# Patient Record
Sex: Male | Born: 1960 | Race: White | Hispanic: No | Marital: Married | Smoking: Former smoker
Health system: Southern US, Community
[De-identification: ages and names within clinical notes are randomized; demographics above are authoritative.]

## PROBLEM LIST (undated history)

## (undated) DIAGNOSIS — E785 Hyperlipidemia, unspecified: Secondary | ICD-10-CM

## (undated) DIAGNOSIS — G4730 Sleep apnea, unspecified: Secondary | ICD-10-CM

## (undated) DIAGNOSIS — I10 Essential (primary) hypertension: Secondary | ICD-10-CM

## (undated) DIAGNOSIS — R7303 Prediabetes: Secondary | ICD-10-CM

## (undated) DIAGNOSIS — M1990 Unspecified osteoarthritis, unspecified site: Secondary | ICD-10-CM

## (undated) HISTORY — PX: POLYPECTOMY: SHX149

## (undated) HISTORY — DX: Unspecified osteoarthritis, unspecified site: M19.90

## (undated) HISTORY — DX: Essential (primary) hypertension: I10

## (undated) HISTORY — DX: Hyperlipidemia, unspecified: E78.5

## (undated) HISTORY — PX: COLONOSCOPY: SHX174

## (undated) HISTORY — PX: HEMORROIDECTOMY: SUR656

## (undated) HISTORY — PX: MENISCUS REPAIR: SHX5179

## (undated) HISTORY — PX: INGUINAL HERNIA REPAIR: SUR1180

## (undated) HISTORY — DX: Sleep apnea, unspecified: G47.30

## (undated) HISTORY — PX: SIGMOIDOSCOPY: SUR1295

---

## 2002-12-27 ENCOUNTER — Encounter: Payer: Self-pay | Admitting: Cardiology

## 2003-01-29 ENCOUNTER — Encounter: Admission: RE | Admit: 2003-01-29 | Discharge: 2003-01-29 | Payer: Self-pay | Admitting: Gastroenterology

## 2003-01-29 ENCOUNTER — Encounter: Payer: Self-pay | Admitting: Gastroenterology

## 2004-12-10 ENCOUNTER — Ambulatory Visit (HOSPITAL_COMMUNITY): Admission: RE | Admit: 2004-12-10 | Discharge: 2004-12-10 | Payer: Self-pay | Admitting: Surgery

## 2004-12-10 ENCOUNTER — Ambulatory Visit (HOSPITAL_BASED_OUTPATIENT_CLINIC_OR_DEPARTMENT_OTHER): Admission: RE | Admit: 2004-12-10 | Discharge: 2004-12-10 | Payer: Self-pay | Admitting: Surgery

## 2010-08-24 ENCOUNTER — Ambulatory Visit (AMBULATORY_SURGERY_CENTER): Payer: PRIVATE HEALTH INSURANCE | Admitting: *Deleted

## 2010-08-24 VITALS — Ht 71.0 in | Wt 200.0 lb

## 2010-08-24 DIAGNOSIS — Z1211 Encounter for screening for malignant neoplasm of colon: Secondary | ICD-10-CM

## 2010-08-24 MED ORDER — PEG-KCL-NACL-NASULF-NA ASC-C 100 G PO SOLR: ORAL | Status: DC

## 2010-09-17 ENCOUNTER — Telehealth: Payer: Self-pay | Admitting: Gastroenterology

## 2010-09-17 NOTE — Telephone Encounter (Signed)
Moviprep Rx called in to [facility].  Pt's wife notified. Ezra Sites

## 2010-09-21 ENCOUNTER — Encounter: Payer: Self-pay | Admitting: Gastroenterology

## 2010-09-21 ENCOUNTER — Ambulatory Visit (AMBULATORY_SURGERY_CENTER): Payer: BC Managed Care – PPO | Admitting: Gastroenterology

## 2010-09-21 VITALS — BP 119/70 | HR 58 | Temp 96.5°F | Resp 16 | Ht 71.0 in | Wt 195.0 lb

## 2010-09-21 DIAGNOSIS — D126 Benign neoplasm of colon, unspecified: Secondary | ICD-10-CM

## 2010-09-21 DIAGNOSIS — Z1211 Encounter for screening for malignant neoplasm of colon: Secondary | ICD-10-CM

## 2010-09-21 DIAGNOSIS — K5730 Diverticulosis of large intestine without perforation or abscess without bleeding: Secondary | ICD-10-CM

## 2010-09-21 MED ORDER — SODIUM CHLORIDE 0.9 % IV SOLN: 500.0000 mL | INTRAVENOUS | Status: DC

## 2010-09-21 NOTE — Patient Instructions (Signed)
See blue & green sheets for discharge instructions.                                                    Information on polyps & diverticulosis & high fiber diet given to carepartner.   Follow safety precautions due to sedation received today.

## 2010-09-22 ENCOUNTER — Telehealth: Payer: Self-pay | Admitting: *Deleted

## 2010-09-24 NOTE — Telephone Encounter (Signed)
NOT Available

## 2011-01-14 NOTE — Progress Notes (Signed)
Addended by: Maple Hudson on: 01/14/2011 05:32 PM   Modules accepted: Orders, Level of Service

## 2015-09-25 ENCOUNTER — Encounter: Payer: Self-pay | Admitting: Gastroenterology

## 2016-10-22 ENCOUNTER — Other Ambulatory Visit: Payer: Self-pay | Admitting: Internal Medicine

## 2016-10-22 ENCOUNTER — Other Ambulatory Visit: Payer: Self-pay | Admitting: Orthopedic Surgery

## 2016-10-22 DIAGNOSIS — M25562 Pain in left knee: Secondary | ICD-10-CM

## 2016-10-30 ENCOUNTER — Ambulatory Visit
Admission: RE | Admit: 2016-10-30 | Discharge: 2016-10-30 | Disposition: A | Discharge status: Home / Self Care | Payer: Self-pay | Source: Ambulatory Visit | Attending: Orthopedic Surgery | Admitting: Orthopedic Surgery

## 2016-10-30 DIAGNOSIS — M25562 Pain in left knee: Secondary | ICD-10-CM

## 2017-09-19 ENCOUNTER — Encounter: Payer: Self-pay | Admitting: Gastroenterology

## 2017-11-25 ENCOUNTER — Encounter: Payer: Self-pay | Admitting: Gastroenterology

## 2017-11-25 ENCOUNTER — Ambulatory Visit (AMBULATORY_SURGERY_CENTER): Payer: Self-pay | Admitting: *Deleted

## 2017-11-25 VITALS — Ht 70.0 in | Wt 197.0 lb

## 2017-11-25 DIAGNOSIS — Z86010 Personal history of colonic polyps: Secondary | ICD-10-CM

## 2017-11-25 MED ORDER — PEG 3350-KCL-NA BICARB-NACL 420 G PO SOLR: 4000.0000 mL | Freq: Once | ORAL | 0 refills | Status: AC

## 2017-11-25 NOTE — Progress Notes (Signed)
No egg or soy allergy known to patient  No issues with past sedation with any surgeries  or procedures, no intubation problems  No diet pills per patient No home 02 use per patient  No blood thinners per patient  Pt denies issues with constipation  No A fib or A flutter  EMMI video sent to pt's e mail pt declined   

## 2017-12-07 ENCOUNTER — Ambulatory Visit (AMBULATORY_SURGERY_CENTER): Payer: 59 | Admitting: Gastroenterology

## 2017-12-07 ENCOUNTER — Encounter: Payer: Self-pay | Admitting: Gastroenterology

## 2017-12-07 VITALS — BP 116/64 | HR 51 | Temp 98.6°F | Resp 10 | Ht 70.0 in | Wt 197.0 lb

## 2017-12-07 DIAGNOSIS — K621 Rectal polyp: Secondary | ICD-10-CM

## 2017-12-07 DIAGNOSIS — Z860100 Personal history of colon polyps, unspecified: Secondary | ICD-10-CM

## 2017-12-07 DIAGNOSIS — D128 Benign neoplasm of rectum: Secondary | ICD-10-CM

## 2017-12-07 DIAGNOSIS — D129 Benign neoplasm of anus and anal canal: Secondary | ICD-10-CM

## 2017-12-07 MED ORDER — SODIUM CHLORIDE 0.9 % IV SOLN: 500.0000 mL | Freq: Once | INTRAVENOUS | Status: DC

## 2017-12-07 NOTE — Progress Notes (Signed)
Pt's states no medical or surgical changes since previsit or office visit. 

## 2017-12-07 NOTE — Progress Notes (Signed)
To PACU, VSS. Report to [email or web address] 

## 2017-12-07 NOTE — Progress Notes (Signed)
Called to room to assist during endoscopic procedure.  Patient ID and intended procedure confirmed with present staff. Received instructions for my participation in the procedure from the performing physician.  

## 2017-12-07 NOTE — Patient Instructions (Signed)
YOU HAD AN ENDOSCOPIC PROCEDURE TODAY AT THE [facility]:   Refer to the procedure report that was given to you for any specific questions about what was found during the examination.  If the procedure report does not answer your questions, please call your gastroenterologist to clarify.  If you requested that your care partner not be given the details of your procedure findings, then the procedure report has been included in a sealed envelope for you to review at your convenience later.  YOU SHOULD EXPECT: Some feelings of bloating in the abdomen. Passage of more gas than usual.  Walking can help get rid of the air that was put into your GI tract during the procedure and reduce the bloating. If you had a lower endoscopy (such as a colonoscopy or flexible sigmoidoscopy) you may notice spotting of blood in your stool or on the toilet paper. If you underwent a bowel prep for your procedure, you may not have a normal bowel movement for a few days.  Please Note:  You might notice some irritation and congestion in your nose or some drainage.  This is from the oxygen used during your procedure.  There is no need for concern and it should clear up in a day or so.  SYMPTOMS TO REPORT IMMEDIATELY:   Following lower endoscopy (colonoscopy or flexible sigmoidoscopy):  Excessive amounts of blood in the stool  Significant tenderness or worsening of abdominal pains  Swelling of the abdomen that is new, acute  Fever of 100F or higher  Please see handouts given to you on Polyps.  For urgent or emergent issues, a gastroenterologist can be reached at any hour by calling [phone].   DIET:  We do recommend a small meal at first, but then you may proceed to your regular diet.  Drink plenty of fluids but you should avoid alcoholic beverages for 24 hours.  ACTIVITY:  You should plan to take it easy for the rest of today and you should NOT DRIVE or use heavy machinery until tomorrow (because of the  sedation medicines used during the test).    FOLLOW UP: Our staff will call the number listed on your records the next business day following your procedure to check on you and address any questions or concerns that you may have regarding the information given to you following your procedure. If we do not reach you, we will leave a message.  However, if you are feeling well and you are not experiencing any problems, there is no need to return our call.  We will assume that you have returned to your regular daily activities without incident.  If any biopsies were taken you will be contacted by phone or by letter within the next 1-3 weeks.  Please call us at [phone] if you have not heard about the biopsies in 3 weeks.    SIGNATURES/CONFIDENTIALITY: You and/or your care partner have signed paperwork which will be entered into your electronic medical record.  These signatures attest to the fact that that the information above on your After Visit Summary has been reviewed and is understood.  Full responsibility of the confidentiality of this discharge information lies with you and/or your care-partner.  Thank you for letting us take care of your healthcare needs today. 

## 2017-12-07 NOTE — Op Note (Signed)
[facility] Patient Name: [name] Procedure Date: 12/07/2017 10:42 AM MRN: [id number] Endoscopist: Milus Banister , MD Age: 57 Referring MD:  Date of Birth: [birth year] Gender: Male Account #: [id number] Procedure:                Colonoscopy Indications:              High risk colon cancer surveillance: Personal                            history of colonic polyps; single subCM SSA removed                            2012 Medicines:                Monitored Anesthesia Care Procedure:                Pre-Anesthesia Assessment:                           - Prior to the procedure, a History and Physical                            was performed, and patient medications and                            allergies were reviewed. The patient's tolerance of                            previous anesthesia was also reviewed. The risks                            and benefits of the procedure and the sedation                            options and risks were discussed with the patient.                            All questions were answered, and informed consent                            was obtained. Prior Anticoagulants: The patient has                            taken no previous anticoagulant or antiplatelet                            agents. ASA Grade Assessment: II - A patient with                            mild systemic disease. After reviewing the risks                            and benefits, the patient was deemed in  satisfactory condition to undergo the procedure.                           After obtaining informed consent, the colonoscope                            was passed under direct vision. Throughout the                            procedure, the patient's blood pressure, pulse, and                            oxygen saturations were monitored continuously. The                            Colonoscope was introduced through the anus and                  advanced to the the cecum, identified by                            appendiceal orifice and ileocecal valve. The                            colonoscopy was performed without difficulty. The                            patient tolerated the procedure well. The quality                            of the bowel preparation was good. The ileocecal                            valve, appendiceal orifice, and rectum were                            photographed. Scope In: 10:43:01 AM Scope Out: 10:57:06 AM Scope Withdrawal Time: 0 hours 10 minutes 50 seconds  Total Procedure Duration: 0 hours 14 minutes 5 seconds  Findings:                 A 2 mm polyp was found in the rectum. The polyp was                            sessile. The polyp was removed with a cold snare.                            Resection and retrieval were complete.                           The exam was otherwise without abnormality on                            direct and retroflexion views. Complications:            No immediate complications. Estimated blood loss:  None. Estimated Blood Loss:     Estimated blood loss: none. Impression:               - One 2 mm polyp in the rectum, removed with a cold                            snare. Resected and retrieved.                           - The examination was otherwise normal on direct                            and retroflexion views. Recommendation:           - Patient has a contact number available for                            emergencies. The signs and symptoms of potential                            delayed complications were discussed with the                            patient. Return to normal activities tomorrow.                            Written discharge instructions were provided to the                            patient.                           - Resume previous diet.                           - Continue present medications.                            You will receive a letter within 2-3 weeks with the                            pathology results and my final recommendations.                           If the polyp(s) is proven to be 'pre-cancerous' on                            pathology, you will need repeat colonoscopy in 5                            years. If the polyp(s) is NOT 'precancerous' on                            pathology then you should repeat colon cancer  screening in 10 years with colonoscopy without need                            for colon cancer screening by any method prior to                            then (including stool testing). Milus Banister, MD 12/07/2017 10:59:27 AM This report has been signed electronically.

## 2017-12-08 ENCOUNTER — Telehealth: Payer: Self-pay

## 2017-12-08 NOTE — Telephone Encounter (Signed)
  Follow up Call-  Call back number 12/07/2017  Post procedure Call Back phone  # [phone]  Permission to leave phone message Yes  Some recent data might be hidden     Patient questions:  Do you have a fever, pain , or abdominal swelling? No. Pain Score  0 *  Have you tolerated food without any problems? Yes.    Have you been able to return to your normal activities? Yes.    Do you have any questions about your discharge instructions: Diet   No. Medications  No. Follow up visit  No.  Do you have questions or concerns about your Care? No.  Actions: * If pain score is 4 or above: No action needed, pain <4.

## 2017-12-14 ENCOUNTER — Encounter: Payer: Self-pay | Admitting: Gastroenterology

## 2019-01-08 NOTE — Patient Instructions (Signed)
DUE TO COVID-19 ONLY ONE VISITOR IS ALLOWED TO COME WITH YOU AND STAY IN THE WAITING ROOM ONLY DURING PRE OP AND PROCEDURE DAY OF SURGERY. THE 1 VISITOR MAY VISIT WITH YOU AFTER SURGERY IN YOUR PRIVATE ROOM DURING VISITING HOURS ONLY!  YOU NEED TO HAVE A COVID 19 TEST ON_______ @_______ , THIS TEST MUST BE DONE BEFORE SURGERY, COME  [name], [place] , [address].  ([place]) ONCE YOUR COVID TEST IS COMPLETED, PLEASE BEGIN THE QUARANTINE INSTRUCTIONS AS OUTLINED IN YOUR HANDOUT.                [name]  01/08/2019   Your procedure is scheduled on: 01-17-19   Report to [facility] Main  Entrance   Report to admitting at        [place] AM     Call this number if you have problems the morning of surgery [phone]    Remember: NO SOLID FOOD AFTER MIDNIGHT THE NIGHT PRIOR TO SURGERY. NOTHING BY MOUTH EXCEPT CLEAR LIQUIDS UNTIL    0530 am . PLEASE FINISH ENSURE DRINK PER SURGEON ORDER  WHICH NEEDS TO BE COMPLETED AT     0530 am then nothing by mouth .    CLEAR LIQUID DIET   Foods Allowed                                                                     Foods Excluded  Coffee and tea, regular and decaf                             liquids that you cannot  Plain Jell-O any favor except red or purple                                           see through such as: Fruit ices (not with fruit pulp)                                     milk, soups, orange juice  Iced Popsicles                                    All solid food Carbonated beverages, regular and diet                                    Cranberry, grape and apple juices Sports drinks like Gatorade Lightly seasoned clear broth or consume(fat free) Sugar, honey syrup  __________________________________________________________________    BRUSH YOUR TEETH MORNING OF SURGERY AND RINSE YOUR MOUTH OUT, NO CHEWING GUM CANDY OR MINTS.     Take these medicines the morning of surgery with A SIP OF  WATER: none  You may not have any metal on your body including hair pins and              piercings  Do not wear jewelry,  lotions, powders or perfumes, deodorant                   Men may shave face and neck.   Do not bring valuables to the hospital. [place].  Contacts, dentures or bridgework may not be worn into surgery.  .               Please read over the following fact sheets you were given: _____________________________________________________________________           [facility] - Preparing for Surgery Before surgery, you can play an important role.  Because skin is not sterile, your skin needs to be as free of germs as possible.  You can reduce the number of germs on your skin by washing with CHG (chlorahexidine gluconate) soap before surgery.  CHG is an antiseptic cleaner which kills germs and bonds with the skin to continue killing germs even after washing. Please DO NOT use if you have an allergy to CHG or antibacterial soaps.  If your skin becomes reddened/irritated stop using the CHG and inform your nurse when you arrive at Short Stay. Do not shave (including legs and underarms) for at least 48 hours prior to the first CHG shower.  You may shave your face/neck. Please follow these instructions carefully:  1.  Shower with CHG Soap the night before surgery and the  morning of Surgery.  2.  If you choose to wash your hair, wash your hair first as usual with your  normal  shampoo.  3.  After you shampoo, rinse your hair and body thoroughly to remove the  shampoo.                           4.  Use CHG as you would any other liquid soap.  You can apply chg directly  to the skin and wash                       Gently with a scrungie or clean washcloth.  5.  Apply the CHG Soap to your body ONLY FROM THE NECK DOWN.   Do not use on face/ open                           Wound or open sores. Avoid contact  with eyes, ears mouth and genitals (private parts).                       Wash face,  Genitals (private parts) with your normal soap.             6.  Wash thoroughly, paying special attention to the area where your surgery  will be performed.  7.  Thoroughly rinse your body with warm water from the neck down.  8.  DO NOT shower/wash with your normal soap after using and rinsing off  the CHG Soap.                9.  Pat yourself dry with a clean towel.  10.  Wear clean pajamas.            11.  Place clean sheets on your bed the night of your first shower and do not  sleep with pets. Day of Surgery : Do not apply any lotions/deodorants the morning of surgery.  Please wear clean clothes to the hospital/surgery center.  FAILURE TO FOLLOW THESE INSTRUCTIONS MAY RESULT IN THE CANCELLATION OF YOUR SURGERY PATIENT SIGNATURE_________________________________  NURSE SIGNATURE__________________________________  ________________________________________________________________________   [name]  An incentive spirometer is a tool that can help keep your lungs clear and active. This tool measures how well you are filling your lungs with each breath. Taking long deep breaths may help reverse or decrease the chance of developing breathing (pulmonary) problems (especially infection) following:  A long period of time when you are unable to move or be active. BEFORE THE PROCEDURE   If the spirometer includes an indicator to show your best effort, your nurse or respiratory therapist will set it to a desired goal.  If possible, sit up straight or lean slightly forward. Try not to slouch.  Hold the incentive spirometer in an upright position. INSTRUCTIONS FOR USE  1. Sit on the edge of your bed if possible, or sit up as far as you can in bed or on a chair. 2. Hold the incentive spirometer in an upright position. 3. Breathe out normally. 4. Place the mouthpiece in your mouth and seal  your lips tightly around it. 5. Breathe in slowly and as deeply as possible, raising the piston or the ball toward the top of the column. 6. Hold your breath for 3-5 seconds or for as long as possible. Allow the piston or ball to fall to the bottom of the column. 7. Remove the mouthpiece from your mouth and breathe out normally. 8. Rest for a few seconds and repeat Steps 1 through 7 at least 10 times every 1-2 hours when you are awake. Take your time and take a few normal breaths between deep breaths. 9. The spirometer may include an indicator to show your best effort. Use the indicator as a goal to work toward during each repetition. 10. After each set of 10 deep breaths, practice coughing to be sure your lungs are clear. If you have an incision (the cut made at the time of surgery), support your incision when coughing by placing a pillow or rolled up towels firmly against it. Once you are able to get out of bed, walk around indoors and cough well. You may stop using the incentive spirometer when instructed by your caregiver.  RISKS AND COMPLICATIONS  Take your time so you do not get dizzy or light-headed.  If you are in pain, you may need to take or ask for pain medication before doing incentive spirometry. It is harder to take a deep breath if you are having pain. AFTER USE  Rest and breathe slowly and easily.  It can be helpful to keep track of a log of your progress. Your caregiver can provide you with a simple table to help with this. If you are using the spirometer at home, follow these instructions: [place] IF:   You are having difficultly using the spirometer.  You have trouble using the spirometer as often as instructed.  Your pain medication is not giving enough relief while using the spirometer.  You develop fever of 100.5 F (38.1 C) or higher. SEEK IMMEDIATE MEDICAL CARE IF:   You cough up bloody sputum  that had not been present before.  You develop fever of  102 F (38.9 C) or greater.  You develop worsening pain at or near the incision site. MAKE SURE YOU:   Understand these instructions.  Will watch your condition.  Will get help right away if you are not doing well or get worse. Document Released: 08/09/2006 Document Revised: 06/21/2011 Document Reviewed: 10/10/2006 Exit[employer] Patient Information 2014 [employer], [place].   ________________________________________________________________________  WHAT IS A BLOOD TRANSFUSION? Blood Transfusion Information  A transfusion is the replacement of blood or some of its parts. Blood is made up of multiple cells which provide different functions.  Red blood cells carry oxygen and are used for blood loss replacement.  White blood cells fight against infection.  Platelets control bleeding.  Plasma helps clot blood.  Other blood products are available for specialized needs, such as hemophilia or other clotting disorders. BEFORE THE TRANSFUSION  Who gives blood for transfusions?   Healthy volunteers who are fully evaluated to make sure their blood is safe. This is blood bank blood. Transfusion therapy is the safest it has ever been in the practice of medicine. Before blood is taken from a donor, a complete history is taken to make sure that person has no history of diseases nor engages in risky social behavior (examples are intravenous drug use or sexual activity with multiple partners). The donor's travel history is screened to minimize risk of transmitting infections, such as malaria. The donated blood is tested for signs of infectious diseases, such as HIV and hepatitis. The blood is then tested to be sure it is compatible with you in order to minimize the chance of a transfusion reaction. If you or a relative donates blood, this is often done in anticipation of surgery and is not appropriate for emergency situations. It takes many days to process the donated blood. RISKS AND COMPLICATIONS Although  transfusion therapy is very safe and saves many lives, the main dangers of transfusion include:   Getting an infectious disease.  Developing a transfusion reaction. This is an allergic reaction to something in the blood you were given. Every precaution is taken to prevent this. The decision to have a blood transfusion has been considered carefully by your caregiver before blood is given. Blood is not given unless the benefits outweigh the risks. AFTER THE TRANSFUSION  Right after receiving a blood transfusion, you will usually feel much better and more energetic. This is especially true if your red blood cells have gotten low (anemic). The transfusion raises the level of the red blood cells which carry oxygen, and this usually causes an energy increase.  The nurse administering the transfusion will monitor you carefully for complications. HOME CARE INSTRUCTIONS  No special instructions are needed after a transfusion. You may find your energy is better. Speak with your caregiver about any limitations on activity for underlying diseases you may have. SEEK MEDICAL CARE IF:   Your condition is not improving after your transfusion.  You develop redness or irritation at the intravenous (IV) site. SEEK IMMEDIATE MEDICAL CARE IF:  Any of the following symptoms occur over the next 12 hours:  Shaking chills.  You have a temperature by mouth above 102 F (38.9 C), not controlled by medicine.  Chest, back, or muscle pain.  People around you feel you are not acting correctly or are confused.  Shortness of breath or difficulty breathing.  Dizziness and fainting.  You get a rash or develop hives.  You have a decrease  in urine output.  Your urine turns a dark color or changes to pink, red, or brown. Any of the following symptoms occur over the next 10 days:  You have a temperature by mouth above 102 F (38.9 C), not controlled by medicine.  Shortness of breath.  Weakness after normal  activity.  The white part of the eye turns yellow (jaundice).  You have a decrease in the amount of urine or are urinating less often.  Your urine turns a dark color or changes to pink, red, or brown. Document Released: 03/26/2000 Document Revised: 06/21/2011 Document Reviewed: 11/13/2007 [facility] Patient Information 2014 [place], [place].  _______________________________________________________________________

## 2019-01-09 ENCOUNTER — Encounter (HOSPITAL_COMMUNITY)
Admission: RE | Admit: 2019-01-09 | Discharge: 2019-01-09 | Disposition: A | Discharge status: Home / Self Care | Payer: PRIVATE HEALTH INSURANCE | Source: Ambulatory Visit | Attending: Orthopedic Surgery | Admitting: Orthopedic Surgery

## 2019-01-09 ENCOUNTER — Encounter (HOSPITAL_COMMUNITY): Payer: Self-pay

## 2019-01-09 ENCOUNTER — Other Ambulatory Visit: Payer: Self-pay

## 2019-01-09 DIAGNOSIS — M1712 Unilateral primary osteoarthritis, left knee: Secondary | ICD-10-CM | POA: Insufficient documentation

## 2019-01-09 DIAGNOSIS — Z01818 Encounter for other preprocedural examination: Secondary | ICD-10-CM | POA: Diagnosis present

## 2019-01-09 HISTORY — DX: Prediabetes: R73.03

## 2019-01-09 LAB — PROTIME-INR
INR: 1 (ref 0.8–1.2)
Prothrombin Time: 12.6 seconds (ref 11.4–15.2)

## 2019-01-09 LAB — APTT: aPTT: 32 seconds (ref 24–36)

## 2019-01-09 LAB — ABO/RH: ABO/RH(D): O POS

## 2019-01-09 LAB — SURGICAL PCR SCREEN
MRSA, PCR: NEGATIVE
Staphylococcus aureus: NEGATIVE

## 2019-01-09 NOTE — Progress Notes (Signed)
PCP - Hulan Fess  Clearance 01-05-19 on chart Cardiologist -   LABS CBC/DIFF, BMP  HGBa1C 01-05-19 on chart  Chest x-ray -  EKG - 9-25-20chart  Stress Test -  ECHO -  Cardiac Cath -   Sleep Study - osa mild CPAP - no  Fasting Blood Sugar -  Checks Blood Sugar _____ times a day  Blood Thinner Instructions: Aspirin Instructions: Last Dose:  Anesthesia review:   Patient denies shortness of breath, fever, cough and chest pain at PAT appointment  NONE   Patient verbalized understanding of instructions that were given to them at the PAT appointment. Patient was also instructed that they will need to review over the PAT instructions again at home before surgery.

## 2019-01-13 ENCOUNTER — Other Ambulatory Visit (HOSPITAL_COMMUNITY)
Admission: RE | Admit: 2019-01-13 | Discharge: 2019-01-13 | Disposition: A | Discharge status: Home / Self Care | Payer: PRIVATE HEALTH INSURANCE | Source: Ambulatory Visit | Attending: Orthopedic Surgery | Admitting: Orthopedic Surgery

## 2019-01-13 DIAGNOSIS — Z20828 Contact with and (suspected) exposure to other viral communicable diseases: Secondary | ICD-10-CM | POA: Diagnosis not present

## 2019-01-13 DIAGNOSIS — Z01812 Encounter for preprocedural laboratory examination: Secondary | ICD-10-CM | POA: Insufficient documentation

## 2019-01-14 LAB — NOVEL CORONAVIRUS, NAA (HOSP ORDER, SEND-OUT TO REF LAB; TAT 18-24 HRS): SARS-CoV-2, NAA: NOT DETECTED

## 2019-01-15 NOTE — H&P (Signed)
TOTAL KNEE ADMISSION H&P  Patient is being admitted for left total knee arthroplasty.  Subjective:  Chief Complaint:left knee pain.  HPI: [name], 58 y.o. male, has a history of pain and functional disability in the left knee due to arthritis and has failed non-surgical conservative treatments for greater than 12 weeks to includeNSAID's and/or analgesics, corticosteriod injections, viscosupplementation injections and activity modification.  Onset of symptoms was gradual, starting 5 years ago with gradually worsening course since that time. The patient noted prior procedures on the knee to include  arthroscopy and menisectomy on the left knee(s).  Patient currently rates pain in the left knee(s) at 7 out of 10 with activity. Patient has night pain, worsening of pain with activity and weight bearing, pain that interferes with activities of daily living, pain with passive range of motion, crepitus and joint swelling.  Patient has evidence of periarticular osteophytes and joint space narrowing by imaging studies. There is no active infection.   Past Medical History:  Diagnosis Date  . Arthritis    knees  . Hyperlipidemia   . Hypertension   . Pre-diabetes     Past Surgical History:  Procedure Laterality Date  . COLONOSCOPY    . HEMORROIDECTOMY    . INGUINAL HERNIA REPAIR    . MENISCUS REPAIR Left   . POLYPECTOMY    . SIGMOIDOSCOPY  9yrs ago   Dr.Edwards    Current Facility-Administered Medications  Medication Dose Route Frequency Provider Last Rate Last Dose  . 0.9 %  sodium chloride infusion  500 mL Intravenous Once Milus Banister, MD       Current Outpatient Medications  Medication Sig Dispense Refill Last Dose  . atorvastatin (LIPITOR) 10 MG tablet Take 10 mg by mouth daily.      Marland Kitchen losartan (COZAAR) 50 MG tablet Take 50 mg by mouth daily.     . Multiple Vitamin (MULTIVITAMIN) tablet Take 1 tablet by mouth daily.        No Known Allergies  Social History   Tobacco Use   . Smoking status: Former Research scientist (life sciences)  . Smokeless tobacco: Former Systems developer  . Tobacco comment: unknown quit date   Substance Use Topics  . Alcohol use: Not Currently    Alcohol/week: 18.0 standard drinks    Types: 18 Cans of beer per week    Frequency: Never    Comment: no etoh now x 3 yrs     Family History  Problem Relation Age of Onset  . Colon cancer Neg Hx   . Colon polyps Neg Hx   . Esophageal cancer Neg Hx   . Rectal cancer Neg Hx   . Stomach cancer Neg Hx      Review of Systems  Constitutional: Negative.   HENT: Negative.   Eyes: Negative.   Respiratory: Negative.   Cardiovascular: Negative.   Gastrointestinal: Negative.   Genitourinary: Negative.   Musculoskeletal: Positive for myalgias. Negative for back pain, falls and neck pain.  Skin: Negative.   Neurological: Negative.   Endo/Heme/Allergies: Negative.   Psychiatric/Behavioral: Negative.     Objective:  Physical Exam  Constitutional: He is oriented to person, place, and time. He appears well-developed. No distress.  Overweight  HENT:  Head: Normocephalic and atraumatic.  Right Ear: External ear normal.  Left Ear: External ear normal.  Nose: Nose normal.  Mouth/Throat: Oropharynx is clear and moist.  Eyes: Conjunctivae and EOM are normal.  Neck: Normal range of motion. Neck supple.  Cardiovascular: Normal rate, regular rhythm,  normal heart sounds and intact distal pulses.  No murmur heard. Respiratory: Effort normal and breath sounds normal. No respiratory distress. He has no wheezes.  GI: Soft. Bowel sounds are normal. He exhibits no distension. There is no abdominal tenderness.  Musculoskeletal:     Comments: Moderate tenderness to palpation about the medial joint line of the left knee. ROM left knee 5-125 degrees. Mild effusion noted in the left knee. No instability about the left knee but mild varus deformity. Mild patellofemoral crepitus in the left knee. Normal painless motion of the hips.   Neurological: He is alert and oriented to person, place, and time. He has normal strength. No sensory deficit.  Skin: No rash noted. He is not diaphoretic. No erythema.  Psychiatric: He has a normal mood and affect. His behavior is normal.    Vital Signs BP: 120/76 sitting L arm Pulse: 68 bpm regular   Estimated body mass index is 28.12 kg/m as calculated from the following:   Height as of 01/09/19: 5\' 10"  (1.778 m).   Weight as of 01/09/19: 88.9 kg.   Imaging Review Plain radiographs demonstrate severe degenerative joint disease of the left knee(s). The overall alignment ismild varus. The bone quality appears to be good for age and reported activity level.   Assessment/Plan:  End stage primary osteoarthritis, left knee   The patient history, physical examination, clinical judgment of the provider and imaging studies are consistent with end stage degenerative joint disease of the left knee(s) and total knee arthroplasty is deemed medically necessary. The treatment options including medical management, injection therapy arthroscopy and arthroplasty were discussed at length. The risks and benefits of total knee arthroplasty were presented and reviewed. The risks due to aseptic loosening, infection, stiffness, patella tracking problems, thromboembolic complications and other imponderables were discussed. The patient acknowledged the explanation, agreed to proceed with the plan and consent was signed. Patient is being admitted for inpatient treatment for surgery, pain control, PT, OT, prophylactic antibiotics, VTE prophylaxis, progressive ambulation and ADL's and discharge planning. The patient is planning to be discharged home.     Anticipated LOS equal to or greater than 2 midnights due to - Age 35 and older with one or more of the following:  - Obesity  - Expected need for hospital services (PT, OT, Nursing) required for safe  discharge  - Anticipated need for postoperative skilled  nursing care or inpatient rehab  - Active co-morbidities: Diabetes OR   - Unanticipated findings during/Post Surgery: None  - Patient is a high risk of re-admission due to: None   Risks and benefits of the surgery were discussed with the patient and Dr.Gioffre at their previous office visit, and the patient has elected to move forward with the aforementioned surgery. Post-operative care plans were discussed with the patient today.  Therapy Plans: outpatient therapy at [facility] on 10/12 Disposition: Home with wife Planned DVT prophylaxis: aspirin 325mg  BID DME needed: has equipment PCP: Dr. Rex Kras Other: no anesthesia concerns  Instructed patient on meds to stop prior to surgery  Ardeen Jourdain, PA-C

## 2019-01-17 ENCOUNTER — Inpatient Hospital Stay (HOSPITAL_COMMUNITY)
Admission: RE | Admit: 2019-01-17 | Discharge: 2019-01-19 | DRG: 470 | Disposition: A | Discharge status: Home / Self Care | Payer: PRIVATE HEALTH INSURANCE | Attending: Orthopedic Surgery | Admitting: Orthopedic Surgery

## 2019-01-17 ENCOUNTER — Encounter (HOSPITAL_COMMUNITY)
Admission: RE | Disposition: A | Discharge status: Home / Self Care | Payer: Self-pay | Source: Home / Self Care | Attending: Orthopedic Surgery

## 2019-01-17 ENCOUNTER — Inpatient Hospital Stay (HOSPITAL_COMMUNITY): Payer: PRIVATE HEALTH INSURANCE | Admitting: Physician Assistant

## 2019-01-17 ENCOUNTER — Encounter (HOSPITAL_COMMUNITY): Payer: Self-pay

## 2019-01-17 ENCOUNTER — Inpatient Hospital Stay (HOSPITAL_COMMUNITY): Payer: PRIVATE HEALTH INSURANCE | Admitting: Certified Registered Nurse Anesthetist

## 2019-01-17 ENCOUNTER — Other Ambulatory Visit: Payer: Self-pay

## 2019-01-17 DIAGNOSIS — E663 Overweight: Secondary | ICD-10-CM | POA: Diagnosis present

## 2019-01-17 DIAGNOSIS — Z79899 Other long term (current) drug therapy: Secondary | ICD-10-CM

## 2019-01-17 DIAGNOSIS — Z6828 Body mass index (BMI) 28.0-28.9, adult: Secondary | ICD-10-CM

## 2019-01-17 DIAGNOSIS — T84033A Mechanical loosening of internal left knee prosthetic joint, initial encounter: Secondary | ICD-10-CM | POA: Diagnosis present

## 2019-01-17 DIAGNOSIS — R7303 Prediabetes: Secondary | ICD-10-CM | POA: Diagnosis present

## 2019-01-17 DIAGNOSIS — I10 Essential (primary) hypertension: Secondary | ICD-10-CM | POA: Diagnosis present

## 2019-01-17 DIAGNOSIS — R42 Dizziness and giddiness: Secondary | ICD-10-CM | POA: Diagnosis not present

## 2019-01-17 DIAGNOSIS — Z87891 Personal history of nicotine dependence: Secondary | ICD-10-CM

## 2019-01-17 DIAGNOSIS — M1712 Unilateral primary osteoarthritis, left knee: Principal | ICD-10-CM | POA: Diagnosis present

## 2019-01-17 DIAGNOSIS — E785 Hyperlipidemia, unspecified: Secondary | ICD-10-CM | POA: Diagnosis present

## 2019-01-17 HISTORY — PX: TOTAL KNEE ARTHROPLASTY: SHX125

## 2019-01-17 LAB — TYPE AND SCREEN
ABO/RH(D): O POS
Antibody Screen: NEGATIVE

## 2019-01-17 SURGERY — ARTHROPLASTY, KNEE, TOTAL
Anesthesia: Spinal | Site: Knee | Laterality: Left

## 2019-01-17 MED ORDER — ACETAMINOPHEN 500 MG PO TABS
1000.0000 mg | ORAL_TABLET | Freq: Four times a day (QID) | ORAL | Status: AC
  Administered 2019-01-17 – 2019-01-18 (×4): 1000 mg via ORAL
  Filled 2019-01-17 (×4): qty 2

## 2019-01-17 MED ORDER — SODIUM CHLORIDE 0.9 % IR SOLN
Status: DC | PRN
  Administered 2019-01-17: 1000 mL

## 2019-01-17 MED ORDER — CEFAZOLIN SODIUM-DEXTROSE 2-4 GM/100ML-% IV SOLN
2.0000 g | INTRAVENOUS | Status: AC
  Administered 2019-01-17: 09:00:00 2000 mg via INTRAVENOUS
  Filled 2019-01-17: qty 100

## 2019-01-17 MED ORDER — ACETAMINOPHEN 325 MG PO TABS: 325.0000 mg | ORAL_TABLET | Freq: Four times a day (QID) | ORAL | Status: DC | PRN

## 2019-01-17 MED ORDER — LOSARTAN POTASSIUM 50 MG PO TABS
50.0000 mg | ORAL_TABLET | Freq: Every day | ORAL | Status: DC
  Filled 2019-01-17: qty 1

## 2019-01-17 MED ORDER — TRANEXAMIC ACID-NACL 1000-0.7 MG/100ML-% IV SOLN
1000.0000 mg | INTRAVENOUS | Status: AC
  Administered 2019-01-17: 09:00:00 1000 mg via INTRAVENOUS
  Filled 2019-01-17: qty 100

## 2019-01-17 MED ORDER — METOCLOPRAMIDE HCL 5 MG PO TABS: 5.0000 mg | ORAL_TABLET | Freq: Three times a day (TID) | ORAL | Status: DC | PRN

## 2019-01-17 MED ORDER — BUPIVACAINE LIPOSOME 1.3 % IJ SUSP
20.0000 mL | Freq: Once | INTRAMUSCULAR | Status: AC
  Administered 2019-01-17: 20 mL
  Filled 2019-01-17: qty 20

## 2019-01-17 MED ORDER — HYDROMORPHONE HCL 1 MG/ML IJ SOLN
0.5000 mg | INTRAMUSCULAR | Status: DC | PRN
  Administered 2019-01-19: 0.5 mg via INTRAVENOUS
  Filled 2019-01-17: qty 1

## 2019-01-17 MED ORDER — PROPOFOL 10 MG/ML IV BOLUS
INTRAVENOUS | Status: AC
  Filled 2019-01-17: qty 20

## 2019-01-17 MED ORDER — DEXAMETHASONE SODIUM PHOSPHATE 10 MG/ML IJ SOLN
INTRAMUSCULAR | Status: DC | PRN
  Administered 2019-01-17: 4 mg via INTRAVENOUS

## 2019-01-17 MED ORDER — SODIUM CHLORIDE 0.9 % IV SOLN
INTRAVENOUS | Status: DC | PRN
  Administered 2019-01-17: 500 mL

## 2019-01-17 MED ORDER — SODIUM CHLORIDE (PF) 0.9 % IJ SOLN
INTRAMUSCULAR | Status: AC
  Filled 2019-01-17: qty 10

## 2019-01-17 MED ORDER — POLYETHYLENE GLYCOL 3350 17 G PO PACK: 17.0000 g | PACK | Freq: Every day | ORAL | Status: DC | PRN

## 2019-01-17 MED ORDER — PROMETHAZINE HCL 25 MG/ML IJ SOLN: 6.2500 mg | INTRAMUSCULAR | Status: DC | PRN

## 2019-01-17 MED ORDER — CLONIDINE HCL (ANALGESIA) 100 MCG/ML EP SOLN
EPIDURAL | Status: DC | PRN
  Administered 2019-01-17: 70 ug

## 2019-01-17 MED ORDER — OXYCODONE HCL 5 MG PO TABS
5.0000 mg | ORAL_TABLET | ORAL | Status: DC | PRN
  Administered 2019-01-17 – 2019-01-18 (×5): 5 mg via ORAL
  Administered 2019-01-19 (×2): 10 mg via ORAL
  Filled 2019-01-17: qty 2
  Filled 2019-01-17 (×6): qty 1
  Filled 2019-01-17: qty 2

## 2019-01-17 MED ORDER — ONDANSETRON HCL 4 MG/2ML IJ SOLN
INTRAMUSCULAR | Status: AC
  Filled 2019-01-17: qty 2

## 2019-01-17 MED ORDER — PROPOFOL 500 MG/50ML IV EMUL
INTRAVENOUS | Status: AC
  Filled 2019-01-17: qty 50

## 2019-01-17 MED ORDER — METHOCARBAMOL 500 MG IVPB - SIMPLE MED
500.0000 mg | Freq: Four times a day (QID) | INTRAVENOUS | Status: DC | PRN
  Filled 2019-01-17: qty 50

## 2019-01-17 MED ORDER — DEXAMETHASONE SODIUM PHOSPHATE 10 MG/ML IJ SOLN
INTRAMUSCULAR | Status: AC
  Filled 2019-01-17: qty 1

## 2019-01-17 MED ORDER — HYDROMORPHONE HCL 1 MG/ML IJ SOLN: 0.2500 mg | INTRAMUSCULAR | Status: DC | PRN

## 2019-01-17 MED ORDER — FLEET ENEMA 7-19 GM/118ML RE ENEM: 1.0000 | ENEMA | Freq: Once | RECTAL | Status: DC | PRN

## 2019-01-17 MED ORDER — METHOCARBAMOL 500 MG PO TABS
500.0000 mg | ORAL_TABLET | Freq: Four times a day (QID) | ORAL | Status: DC | PRN
  Administered 2019-01-17 – 2019-01-18 (×2): 500 mg via ORAL
  Filled 2019-01-17 (×2): qty 1

## 2019-01-17 MED ORDER — SODIUM CHLORIDE 0.9 % IV SOLN
INTRAVENOUS | Status: AC
  Filled 2019-01-17: qty 500000

## 2019-01-17 MED ORDER — MIDAZOLAM HCL 2 MG/2ML IJ SOLN
INTRAMUSCULAR | Status: AC
  Filled 2019-01-17: qty 2

## 2019-01-17 MED ORDER — BISACODYL 5 MG PO TBEC: 5.0000 mg | DELAYED_RELEASE_TABLET | Freq: Every day | ORAL | Status: DC | PRN

## 2019-01-17 MED ORDER — MIDAZOLAM HCL 5 MG/5ML IJ SOLN
INTRAMUSCULAR | Status: DC | PRN
  Administered 2019-01-17: 1 mg via INTRAVENOUS

## 2019-01-17 MED ORDER — STERILE WATER FOR IRRIGATION IR SOLN
Status: DC | PRN
  Administered 2019-01-17: 2000 mL

## 2019-01-17 MED ORDER — BUPIVACAINE HCL (PF) 0.75 % IJ SOLN
INTRAMUSCULAR | Status: DC | PRN
  Administered 2019-01-17: 2 mL via INTRATHECAL

## 2019-01-17 MED ORDER — PROPOFOL 10 MG/ML IV BOLUS
INTRAVENOUS | Status: DC | PRN
  Administered 2019-01-17: 30 mg via INTRAVENOUS

## 2019-01-17 MED ORDER — ALUM & MAG HYDROXIDE-SIMETH 200-200-20 MG/5ML PO SUSP: 30.0000 mL | ORAL | Status: DC | PRN

## 2019-01-17 MED ORDER — METOCLOPRAMIDE HCL 5 MG/ML IJ SOLN: 5.0000 mg | Freq: Three times a day (TID) | INTRAMUSCULAR | Status: DC | PRN

## 2019-01-17 MED ORDER — ONDANSETRON HCL 4 MG/2ML IJ SOLN: 4.0000 mg | Freq: Four times a day (QID) | INTRAMUSCULAR | Status: DC | PRN

## 2019-01-17 MED ORDER — ONDANSETRON HCL 4 MG/2ML IJ SOLN
INTRAMUSCULAR | Status: DC | PRN
  Administered 2019-01-17: 4 mg via INTRAVENOUS

## 2019-01-17 MED ORDER — SODIUM CHLORIDE (PF) 0.9 % IJ SOLN
INTRAMUSCULAR | Status: DC | PRN
  Administered 2019-01-17: 20 mL

## 2019-01-17 MED ORDER — FENTANYL CITRATE (PF) 100 MCG/2ML IJ SOLN
INTRAMUSCULAR | Status: DC | PRN
  Administered 2019-01-17 (×2): 50 ug via INTRAVENOUS

## 2019-01-17 MED ORDER — CEFAZOLIN SODIUM-DEXTROSE 1-4 GM/50ML-% IV SOLN
1.0000 g | Freq: Four times a day (QID) | INTRAVENOUS | Status: AC
  Administered 2019-01-17 (×2): 1 g via INTRAVENOUS
  Filled 2019-01-17 (×2): qty 50

## 2019-01-17 MED ORDER — FERROUS SULFATE 325 (65 FE) MG PO TABS
325.0000 mg | ORAL_TABLET | Freq: Three times a day (TID) | ORAL | Status: DC
  Administered 2019-01-17 – 2019-01-19 (×5): 325 mg via ORAL
  Filled 2019-01-17 (×5): qty 1

## 2019-01-17 MED ORDER — LACTATED RINGERS IV SOLN
INTRAVENOUS | Status: DC
  Administered 2019-01-17 (×2): via INTRAVENOUS

## 2019-01-17 MED ORDER — CHLORHEXIDINE GLUCONATE 4 % EX LIQD: 60.0000 mL | Freq: Once | CUTANEOUS | Status: DC

## 2019-01-17 MED ORDER — MENTHOL 3 MG MT LOZG: 1.0000 | LOZENGE | OROMUCOSAL | Status: DC | PRN

## 2019-01-17 MED ORDER — PROPOFOL 500 MG/50ML IV EMUL
INTRAVENOUS | Status: DC | PRN
  Administered 2019-01-17: 100 ug/kg/min via INTRAVENOUS

## 2019-01-17 MED ORDER — PHENOL 1.4 % MT LIQD: 1.0000 | OROMUCOSAL | Status: DC | PRN

## 2019-01-17 MED ORDER — RIVAROXABAN 10 MG PO TABS
10.0000 mg | ORAL_TABLET | Freq: Every day | ORAL | Status: DC
  Administered 2019-01-18 – 2019-01-19 (×2): 10 mg via ORAL
  Filled 2019-01-17 (×2): qty 1

## 2019-01-17 MED ORDER — OXYCODONE HCL 5 MG PO TABS: 10.0000 mg | ORAL_TABLET | ORAL | Status: DC | PRN

## 2019-01-17 MED ORDER — DOCUSATE SODIUM 100 MG PO CAPS
100.0000 mg | ORAL_CAPSULE | Freq: Two times a day (BID) | ORAL | Status: DC
  Administered 2019-01-17 – 2019-01-19 (×5): 100 mg via ORAL
  Filled 2019-01-17 (×5): qty 1

## 2019-01-17 MED ORDER — ONDANSETRON HCL 4 MG PO TABS: 4.0000 mg | ORAL_TABLET | Freq: Four times a day (QID) | ORAL | Status: DC | PRN

## 2019-01-17 MED ORDER — FENTANYL CITRATE (PF) 100 MCG/2ML IJ SOLN
INTRAMUSCULAR | Status: AC
  Filled 2019-01-17: qty 2

## 2019-01-17 MED ORDER — LACTATED RINGERS IV SOLN
INTRAVENOUS | Status: DC
  Administered 2019-01-17 – 2019-01-18 (×2): via INTRAVENOUS

## 2019-01-17 MED ORDER — ROPIVACAINE HCL 5 MG/ML IJ SOLN
INTRAMUSCULAR | Status: DC | PRN
  Administered 2019-01-17: 20 mL via PERINEURAL

## 2019-01-17 MED ORDER — POVIDONE-IODINE 10 % EX SWAB
2.0000 "application " | Freq: Once | CUTANEOUS | Status: AC
  Administered 2019-01-17: 2 via TOPICAL

## 2019-01-17 SURGICAL SUPPLY — 71 items
AGENT HMST SPONGE THK3/8 (HEMOSTASIS) ×1
BAG DECANTER FOR FLEXI CONT (MISCELLANEOUS) ×5 IMPLANT
BAG SPEC THK2 15X12 ZIP CLS (MISCELLANEOUS) ×1
BAG ZIPLOCK 12X15 (MISCELLANEOUS) ×2 IMPLANT
BLADE SAG 18X100X1.27 (BLADE) ×3 IMPLANT
BLADE SAW SGTL 11.0X1.19X90.0M (BLADE) ×3 IMPLANT
BLADE SURG SZ10 CARB STEEL (BLADE) ×6 IMPLANT
BNDG ELASTIC 4X5.8 VLCR STR LF (GAUZE/BANDAGES/DRESSINGS) ×3 IMPLANT
BNDG ELASTIC 6X5.8 VLCR STR LF (GAUZE/BANDAGES/DRESSINGS) ×3 IMPLANT
BNDG GAUZE ELAST 4 BULKY (GAUZE/BANDAGES/DRESSINGS) ×6 IMPLANT
CEMENT HV SMART SET (Cement) ×6 IMPLANT
CEMENT TIBIA MBT SIZE 5 (Knees) IMPLANT
CLOSURE WOUND 1/2 X4 (GAUZE/BANDAGES/DRESSINGS) ×2
COVER SURGICAL LIGHT HANDLE (MISCELLANEOUS) ×3 IMPLANT
COVER WAND RF STERILE (DRAPES) IMPLANT
CUFF TOURN SGL QUICK 34 (TOURNIQUET CUFF) ×3
CUFF TRNQT CYL 34X4.125X (TOURNIQUET CUFF) ×1 IMPLANT
DECANTER SPIKE VIAL GLASS SM (MISCELLANEOUS) ×3 IMPLANT
DRAPE INCISE IOBAN 66X45 STRL (DRAPES) IMPLANT
DRAPE U-SHAPE 47X51 STRL (DRAPES) ×3 IMPLANT
DRSG ADAPTIC 3X8 NADH LF (GAUZE/BANDAGES/DRESSINGS) ×3 IMPLANT
DRSG PAD ABDOMINAL 8X10 ST (GAUZE/BANDAGES/DRESSINGS) ×6 IMPLANT
DURAPREP 26ML APPLICATOR (WOUND CARE) ×3 IMPLANT
ELECT BLADE TIP CTD 4 INCH (ELECTRODE) ×3 IMPLANT
ELECT REM PT RETURN 15FT ADLT (MISCELLANEOUS) ×3 IMPLANT
EVACUATOR 1/8 PVC DRAIN (DRAIN) ×3 IMPLANT
FACESHIELD WRAPAROUND (MASK) ×15 IMPLANT
FACESHIELD WRAPAROUND OR TEAM (MASK) ×1 IMPLANT
FEMUR SIGMA PS SZ 4.0 L (Knees) ×2 IMPLANT
GAUZE SPONGE 4X4 12PLY STRL (GAUZE/BANDAGES/DRESSINGS) ×3 IMPLANT
GLOVE BIOGEL PI IND STRL 6.5 (GLOVE) ×1 IMPLANT
GLOVE BIOGEL PI IND STRL 8.5 (GLOVE) ×1 IMPLANT
GLOVE BIOGEL PI INDICATOR 6.5 (GLOVE) ×2
GLOVE BIOGEL PI INDICATOR 8.5 (GLOVE) ×2
GLOVE ECLIPSE 8.0 STRL XLNG CF (GLOVE) ×6 IMPLANT
GLOVE SURG SS PI 6.5 STRL IVOR (GLOVE) ×3 IMPLANT
GOWN STRL REUS W/TWL LRG LVL3 (GOWN DISPOSABLE) ×3 IMPLANT
GOWN STRL REUS W/TWL XL LVL3 (GOWN DISPOSABLE) ×3 IMPLANT
HANDPIECE INTERPULSE COAX TIP (DISPOSABLE) ×3
HEMOSTAT SPONGE AVITENE ULTRA (HEMOSTASIS) ×3 IMPLANT
HOLDER FOLEY CATH W/STRAP (MISCELLANEOUS) IMPLANT
IMMOBILIZER KNEE 20 (SOFTGOODS) ×3
IMMOBILIZER KNEE 20 THIGH 36 (SOFTGOODS) ×1 IMPLANT
KIT TURNOVER KIT A (KITS) IMPLANT
MANIFOLD NEPTUNE II (INSTRUMENTS) ×3 IMPLANT
PACK TOTAL KNEE CUSTOM (KITS) ×3 IMPLANT
PADDING CAST COTTON 6X4 STRL (CAST SUPPLIES) ×3 IMPLANT
PATELLA DOME PFC 41MM (Knees) ×2 IMPLANT
PENCIL SMOKE EVAC W/HOLSTER (ELECTROSURGICAL) ×3 IMPLANT
PIN STEINMAN FIXATION KNEE (PIN) ×2 IMPLANT
PLATE ROT INSERT 12.5MM SIZE 4 (Plate) ×2 IMPLANT
PROTECTOR NERVE ULNAR (MISCELLANEOUS) ×3 IMPLANT
SET HNDPC FAN SPRY TIP SCT (DISPOSABLE) ×1 IMPLANT
SET PAD KNEE POSITIONER (MISCELLANEOUS) ×3 IMPLANT
SPONGE LAP 18X18 RF (DISPOSABLE) IMPLANT
STRIP CLOSURE SKIN 1/2X4 (GAUZE/BANDAGES/DRESSINGS) ×4 IMPLANT
SUT BONE WAX W31G (SUTURE) IMPLANT
SUT MNCRL AB 4-0 PS2 18 (SUTURE) ×3 IMPLANT
SUT STRATAFIX 0 PDS 27 VIOLET (SUTURE) ×3
SUT VIC AB 1 CT1 27 (SUTURE) ×3
SUT VIC AB 1 CT1 27XBRD ANTBC (SUTURE) ×1 IMPLANT
SUT VIC AB 2-0 CT1 27 (SUTURE) ×9
SUT VIC AB 2-0 CT1 TAPERPNT 27 (SUTURE) ×3 IMPLANT
SUTURE STRATFX 0 PDS 27 VIOLET (SUTURE) ×1 IMPLANT
SYR 20ML LL LF (SYRINGE) ×6 IMPLANT
TIBIA MBT CEMENT SIZE 5 (Knees) ×3 IMPLANT
TOWER CARTRIDGE SMART MIX (DISPOSABLE) ×3 IMPLANT
TRAY FOLEY MTR SLVR 16FR STAT (SET/KITS/TRAYS/PACK) ×3 IMPLANT
WATER STERILE IRR 1000ML POUR (IV SOLUTION) ×3 IMPLANT
WRAP KNEE MAXI GEL POST OP (GAUZE/BANDAGES/DRESSINGS) ×3 IMPLANT
YANKAUER SUCT BULB TIP 10FT TU (MISCELLANEOUS) ×3 IMPLANT

## 2019-01-17 NOTE — Anesthesia Preprocedure Evaluation (Signed)
Anesthesia Evaluation  Patient identified by MRN, date of birth, ID band Patient awake    Reviewed: Allergy & Precautions, NPO status , Patient's Chart, lab work & pertinent test results  Airway Mallampati: II  TM Distance: >3 FB Neck ROM: Full    Dental no notable dental hx.    Pulmonary neg pulmonary ROS, former smoker,    Pulmonary exam normal breath sounds clear to auscultation       Cardiovascular hypertension, Normal cardiovascular exam Rhythm:Regular Rate:Normal     Neuro/Psych negative neurological ROS  negative psych ROS   GI/Hepatic negative GI ROS, Neg liver ROS,   Endo/Other  negative endocrine ROS  Renal/GU negative Renal ROS  negative genitourinary   Musculoskeletal negative musculoskeletal ROS (+)   Abdominal   Peds negative pediatric ROS (+)  Hematology negative hematology ROS (+)   Anesthesia Other Findings   Reproductive/Obstetrics negative OB ROS                             Anesthesia Physical Anesthesia Plan  ASA: II  Anesthesia Plan: Spinal   Post-op Pain Management:    Induction: Intravenous  PONV Risk Score and Plan: 2 and Ondansetron, Dexamethasone and Treatment may vary due to age or medical condition  Airway Management Planned: Simple Face Mask  Additional Equipment:   Intra-op Plan:   Post-operative Plan:   Informed Consent: I have reviewed the patients History and Physical, chart, labs and discussed the procedure including the risks, benefits and alternatives for the proposed anesthesia with the patient or authorized representative who has indicated his/her understanding and acceptance.     Dental advisory given  Plan Discussed with: CRNA and Surgeon  Anesthesia Plan Comments:         Anesthesia Quick Evaluation

## 2019-01-17 NOTE — Plan of Care (Signed)

## 2019-01-17 NOTE — Anesthesia Procedure Notes (Signed)
Anesthesia Procedure Image    

## 2019-01-17 NOTE — Evaluation (Signed)
Physical Therapy Evaluation Patient Details Name: [name] MRN: [id number] DOB: [birth year] Today's Date: 01/17/2019   History of Present Illness  Patient is 58 y.o. male s/p Lt TKA on 01/17/19 with PMH significant for HTN, HLD, and OA.  Clinical Impression  [name] is a 59 y.o. male POD 0 s/p Lt TKA. Patient reports independence with mobility at baseline. Patient is now limited by functional impairments (see PT problem list below) and requires min assist for transfers and gait with RW. Patient was able to ambulate ~50 feet with RW and min assist to steady balance secondary to difficulty coordinating Rt LE mobility. Patient instructed in exercise to facilitate ROM and circulation to manage edema. Patient will benefit from continued skilled PT interventions to address impairments and progress towards PLOF. Acute PT will follow to progress mobility and stair training in preparation for safe discharge home.     Follow Up Recommendations Follow surgeon's recommendation for DC plan and follow-up therapies    Equipment Recommendations  None recommended by PT    Recommendations for Other Services       Precautions / Restrictions Precautions Precautions: Fall Restrictions Weight Bearing Restrictions: No      Mobility  Bed Mobility Overal bed mobility: Needs Assistance Bed Mobility: Supine to Sit     Supine to sit: HOB elevated;Min guard     General bed mobility comments: min guard with cues for transfer, no assist required, pt using bed rails  Transfers Overall transfer level: Needs assistance Equipment used: Rolling walker (2 wheeled) Transfers: Sit to/from Stand Sit to Stand: Min assist         General transfer comment: cues for safe hand placement and technique, assist to initiate power up and steady with rising to stand  Ambulation/Gait Ambulation/Gait assistance: Min assist Gait Distance (Feet): 50 Feet Assistive device: Rolling walker (2 wheeled) Gait  Pattern/deviations: Step-to pattern;Drifts right/left;Decreased stance time - left;Decreased stride length;Scissoring;Narrow base of support Gait velocity: decreased   General Gait Details: pt with slow pace and reduced weight shift Lt, pt with difficulty coordinating Rt LE stepping and leaning to Rt requiring min assist to maintain upright posture and prevent LOB.  Stairs            Wheelchair Mobility    Modified Rankin (Stroke Patients Only)       Balance Overall balance assessment: Needs assistance Sitting-balance support: No upper extremity supported;Feet supported Sitting balance-Leahy Scale: Good     Standing balance support: During functional activity;Bilateral upper extremity supported Standing balance-Leahy Scale: Poor Standing balance comment: requires support in standing          Pertinent Vitals/Pain Pain Assessment: No/denies pain    Home Living Family/patient expects to be discharged to:: Private residence Living Arrangements: Spouse/significant other Available Help at Discharge: Family Type of Home: House Home Access: Stairs to enter Entrance Stairs-Rails: Right Entrance Stairs-Number of Steps: 3 steps Home Layout: One level Home Equipment: Walker - 2 wheels;Walker - 4 wheels;Cane - single point;Shower seat;Shower seat - built in      Prior Function Level of Independence: Independent               Hand Dominance   Dominant Hand: Right    Extremity/Trunk Assessment   Upper Extremity Assessment Upper Extremity Assessment: Overall WFL for tasks assessed    Lower Extremity Assessment Lower Extremity Assessment: RLE deficits/detail;LLE deficits/detail RLE Deficits / Details: slightly decreased sensation at Rt hip and pt with difficulty coordinating step sequence/pattern RLE Sensation:  decreased light touch;decreased proprioception RLE Coordination: decreased gross motor LLE Deficits / Details: pt with good quad activaiton and no  extensor lag with SLR, 4/5 for quad and 3/5 for hip flexor or better LLE Sensation: WNL LLE Coordination: WNL    Cervical / Trunk Assessment Cervical / Trunk Assessment: Normal  Communication   Communication: No difficulties  Cognition Arousal/Alertness: Awake/alert Behavior During Therapy: WFL for tasks assessed/performed Overall Cognitive Status: Within Functional Limits for tasks assessed       General Comments      Exercises Total Joint Exercises Ankle Circles/Pumps: AROM;10 reps;Seated;Both Quad Sets: AROM;10 reps;Seated;Supine;Left(5 reps supine, 5 reps seated) Heel Slides: AAROM;10 reps;Seated;Left(with gait belt)   Assessment/Plan    PT Assessment Patient needs continued PT services  PT Problem List Decreased strength;Decreased balance;Decreased mobility;Decreased range of motion;Decreased activity tolerance;Decreased coordination;Decreased knowledge of use of DME       PT Treatment Interventions DME instruction;Balance training;Functional mobility training;Patient/family education;Modalities;Gait training;Therapeutic activities;Therapeutic exercise;Stair training    PT Goals (Current goals can be found in the Care Plan section)  Acute Rehab PT Goals Patient Stated Goal: to return home and get back to independent mobility PT Goal Formulation: With patient Time For Goal Achievement: 01/24/19 Potential to Achieve Goals: Good    Frequency 7X/week    AM-PAC PT "6 Clicks" Mobility  Outcome Measure Help needed turning from your back to your side while in a flat bed without using bedrails?: A Little Help needed moving from lying on your back to sitting on the side of a flat bed without using bedrails?: A Little Help needed moving to and from a bed to a chair (including a wheelchair)?: A Little Help needed standing up from a chair using your arms (e.g., wheelchair or bedside chair)?: A Little Help needed to walk in hospital room?: A Little Help needed climbing 3-5  steps with a railing? : A Little 6 Click Score: 18    End of Session Equipment Utilized During Treatment: Gait belt Activity Tolerance: Patient tolerated treatment well Patient left: with call bell/phone within reach;in chair;with chair alarm set Nurse Communication: Mobility status PT Visit Diagnosis: Other abnormalities of gait and mobility (R26.89);Muscle weakness (generalized) (M62.81);Difficulty in walking, not elsewhere classified (R26.2)    Time: [id number] PT Time Calculation (min) (ACUTE ONLY): 26 min   Charges:   PT Evaluation $PT Eval Low Complexity: 1 Low PT Treatments $Gait Training: 8-22 mins        Kipp Brood, PT, DPT, [facility] Physical Therapist with [facility]  01/17/2019 5:01 PM

## 2019-01-17 NOTE — Op Note (Signed)
NAME: [name], [name] MEDICAL RECORD [id number] ACCOUNT [id number] DATE OF BIRTH:02/05/1961 FACILITY: [facility] LOCATION: [facility] PHYSICIAN:Taniqua Issa Fransico Setters, MD  OPERATIVE REPORT  DATE OF PROCEDURE:  01/17/2019  SURGEON:  Latanya Maudlin, MD  ASSISTANT:  Ardeen Jourdain, PA-C  PREOPERATIVE DIAGNOSES:  Primary osteoarthritis with bone-on-bone of the left knee.  POSTOPERATIVE DIAGNOSES:  Primary osteoarthritis with bone-on-bone of the left knee.  PROCEDURE:  Left total knee arthroplasty.  The prosthesis used was a cemented DePuy left posterior cruciate sacrificing-type knee.  The size of the femoral component was a size 4 left.  The tibial tray was a size 5.  The insert was a size 4, 12.5 mm  thickness.  The patella was a size 41 with 3 pegs.  DESCRIPTION OF PROCEDURE:  Under spinal anesthesia, a routine orthopedic prep and draping of the left lower extremity was carried out.  Note the appropriate timeout was first carried out.  I also marked the appropriate left leg in the holding area.  At  this time after the prep and draping, the leg was exsanguinated with an Esmarch, tourniquet was elevated to 325 mmHg.  Left leg was placed in a De Mayo knee holder.  The knee was flexed.  An anterior approach to the knee was carried out.  Two flaps were  created.  I then carried out a median parapatellar incision, reflected the patella laterally, and flexed the knee.  I then did medial and lateral meniscectomies and excised the anterior and posterior cruciate ligaments.  At that particular time, we then  made our appropriate drill hole through the femoral condyle.  The guide rod was inserted up the canal.  We then removed 11 mm thickness off the distal femur utilizing the appropriate guide.  Following that, we measured the femur to be a size 4 left.  We  then utilized our posterior and anterior chamfering cuts in the usual fashion for the prosthesis.  Once this was completed, we then went down and directed  our attention to the tibial plateau.  The oscillating saw was utilized to remove the spinous  processes.  I did a preliminary measurement of the plateau to be a size 5.  At this time, we then used our external guide and removed 4 mm thickness across the tibial plateau, utilizing the medial side as our guide.  After this was carried out, we then  inserted our lamina spreaders to examine the knee.  There were no spurs present.  We then completed our meniscectomies.  I then inserted my spacer blocks.  We selected a 12.5 mm spacer block.  At this particular time, we then progressed on and did our  tibial plateau keel cut in the usual fashion.  We then did our notch cut out of the distal femur in the usual fashion.  The trial components were inserted.  We had a nice trial fit.  We then reflected the patella and did a resurfacing procedure on the  patella.  Three drill holes were made in the patella for a size 41 patella.  The patellar button was inserted.  We had a nice fit with that as well.  We then removed all 3 trial components and then cemented all 3 components in simultaneously.  Once the  cement was hardened, we removed all loose pieces of cement.  We thoroughly waterpicked out the knee.  I then inserted my size 4, 12.5 mm thickness insert, reduced the knee, and had excellent function and excellent stability.  We had a  real nice fit with  our patella as well.  We irrigated out the knee again, and then I went ahead and inserted a Hemovac drain and closed the knee in layers in the usual fashion.  The knee was taken through flexion and extension.  We had good flexion, extension, good  stability far as medial lateral plane.  We then closed the wound in layers over a Hemovac drain in the usual fashion.  Sterile dressings were applied.  He had 2 g of IV Ancef preop.  Preop, he also had a spinal anesthesia and he also had an adductor  canal nerve block.  The patient will be kept in the hospital for pain  control.  LN/NUANCE  D:01/17/2019 T:01/17/2019 JOB:[id number]

## 2019-01-17 NOTE — Interval H&P Note (Signed)
History and Physical Interval Note:  01/17/2019 8:08 AM  [name]  has presented today for surgery, with the diagnosis of left knee osteoarthritis.  The various methods of treatment have been discussed with the patient and family. After consideration of risks, benefits and other options for treatment, the patient has consented to  Procedure(s) with comments: TOTAL KNEE ARTHROPLASTY (Left) - 179min as a surgical intervention.  The patient's history has been reviewed, patient examined, no change in status, stable for surgery.  I have reviewed the patient's chart and labs.  Questions were answered to the patient's satisfaction.     Latanya Maudlin

## 2019-01-17 NOTE — Anesthesia Postprocedure Evaluation (Signed)
Anesthesia Post Note  Patient: [name]  Procedure(s) Performed: TOTAL KNEE ARTHROPLASTY (Left Knee)     Patient location during evaluation: PACU Anesthesia Type: Spinal Level of consciousness: oriented and awake and alert Pain management: pain level controlled Vital Signs Assessment: post-procedure vital signs reviewed and stable Respiratory status: spontaneous breathing, respiratory function stable and patient connected to nasal cannula oxygen Cardiovascular status: blood pressure returned to baseline and stable Postop Assessment: no headache, no backache and no apparent nausea or vomiting Anesthetic complications: no    Last Vitals:  Vitals:   01/17/19 1517 01/17/19 1548  BP: 105/62 110/63  Pulse: (!) 59 (!) 57  Resp: 16 16  Temp: 36.6 C (!) 36.4 C  SpO2: 98% 99%    Last Pain:  Vitals:   01/17/19 1548  TempSrc: Oral  PainSc:                  Robertha Staples S

## 2019-01-17 NOTE — Brief Op Note (Signed)
01/17/2019  10:00 AM  PATIENT:  [name]  58 y.o. male  PRE-OPERATIVE DIAGNOSIS:  left knee Primary  osteoarthritis  POST-OPERATIVE DIAGNOSIS:  Left knee primary  osteoarthritis  PROCEDURE:  Procedure(s) with comments: TOTAL KNEE ARTHROPLASTY (Left) - 167min  SURGEON:  Surgeon(s) and Role:    Latanya Maudlin, MD - Primary  PHYSICIAN ASSISTANT:AmberConstable PA   ASSISTANTS: Ardeen Jourdain PA  ANESTHESIA:   spinal  EBL:  50 mL   BLOOD ADMINISTERED:none  DRAINS: (One) Hemovact drain(s) in the Left Knee. with  Suction Open   LOCAL MEDICATIONS USED:  OTHER 20cc of Exparel  SPECIMEN:  No Specimen  DISPOSITION OF SPECIMEN:  N/A  COUNTS:  YES  TOURNIQUET:  * Missing tourniquet times found for documented tourniquets in log: [id number] *  DICTATION: .Other Dictation: Dictation Number [id number]  PLAN OF CARE: Admit to inpatient   PATIENT DISPOSITION:  Stable in OR   Delay start of Pharmacological VTE agent (>24hrs) due to surgical blood loss or risk of bleeding: yes

## 2019-01-17 NOTE — Anesthesia Procedure Notes (Signed)
Anesthesia Regional Block: Adductor canal block   Pre-Anesthetic Checklist: ,, timeout performed, Correct Patient, Correct Site, Correct Laterality, Correct Procedure, Correct Position, site marked, Risks and benefits discussed,  Surgical consent,  Pre-op evaluation,  At surgeon's request and post-op pain management  Laterality: Left  Prep: chloraprep       Needles:  Injection technique: Single-shot  Needle Type: Echogenic Needle     Needle Length: 9cm      Additional Needles:   Procedures:,,,, ultrasound used (permanent image in chart),,,,  Narrative:  Start time: 01/17/2019 7:59 AM End time: 01/17/2019 8:06 AM Injection made incrementally with aspirations every 5 mL.  Performed by: Personally  Anesthesiologist: Myrtie Soman, MD  Additional Notes: Patient tolerated the procedure well without complications

## 2019-01-17 NOTE — Transfer of Care (Signed)
Immediate Anesthesia Transfer of Care Note  Patient: [name]  Procedure(s) Performed: TOTAL KNEE ARTHROPLASTY (Left Knee)  Patient Location: PACU  Anesthesia Type:Spinal  Level of Consciousness: awake, alert , oriented and patient cooperative  Airway & Oxygen Therapy: Patient Spontanous Breathing and Patient connected to face mask  Post-op Assessment: Report given to RN and Post -op Vital signs reviewed and stable  Post vital signs: Reviewed and stable  Last Vitals:  Vitals Value Taken Time  BP    Temp    Pulse    Resp    SpO2      Last Pain:  Vitals:   01/17/19 0728  TempSrc:   PainSc: 0-No pain         Complications: No apparent anesthesia complications

## 2019-01-17 NOTE — Anesthesia Procedure Notes (Signed)
Spinal  Patient location during procedure: OR Start time: 01/17/2019 8:27 AM End time: 01/17/2019 8:31 AM Staffing Resident/CRNA: Claudia Desanctis, CRNA Performed: resident/CRNA  Preanesthetic Checklist Completed: patient identified, site marked, surgical consent, pre-op evaluation, timeout performed, IV checked, risks and benefits discussed and monitors and equipment checked Spinal Block Patient position: sitting Prep: DuraPrep Patient monitoring: heart rate, cardiac monitor, continuous pulse ox and blood pressure Approach: midline Location: L3-4 Injection technique: single-shot Needle Needle type: Pencan  Needle gauge: 24 G Needle length: 10 cm Needle insertion depth: 8 cm Assessment Sensory level: T4

## 2019-01-18 ENCOUNTER — Encounter (HOSPITAL_COMMUNITY): Payer: Self-pay | Admitting: Orthopedic Surgery

## 2019-01-18 DIAGNOSIS — Z87891 Personal history of nicotine dependence: Secondary | ICD-10-CM | POA: Diagnosis not present

## 2019-01-18 DIAGNOSIS — M1712 Unilateral primary osteoarthritis, left knee: Secondary | ICD-10-CM | POA: Diagnosis present

## 2019-01-18 DIAGNOSIS — E785 Hyperlipidemia, unspecified: Secondary | ICD-10-CM | POA: Diagnosis present

## 2019-01-18 DIAGNOSIS — R7303 Prediabetes: Secondary | ICD-10-CM | POA: Diagnosis present

## 2019-01-18 DIAGNOSIS — R42 Dizziness and giddiness: Secondary | ICD-10-CM | POA: Diagnosis not present

## 2019-01-18 DIAGNOSIS — Z79899 Other long term (current) drug therapy: Secondary | ICD-10-CM | POA: Diagnosis not present

## 2019-01-18 DIAGNOSIS — I10 Essential (primary) hypertension: Secondary | ICD-10-CM | POA: Diagnosis present

## 2019-01-18 DIAGNOSIS — Z6828 Body mass index (BMI) 28.0-28.9, adult: Secondary | ICD-10-CM | POA: Diagnosis not present

## 2019-01-18 DIAGNOSIS — E663 Overweight: Secondary | ICD-10-CM | POA: Diagnosis present

## 2019-01-18 LAB — CBC
HCT: 33.1 % — ABNORMAL LOW (ref 39.0–52.0)
Hemoglobin: 10.7 g/dL — ABNORMAL LOW (ref 13.0–17.0)
MCH: 28.1 pg (ref 26.0–34.0)
MCHC: 32.3 g/dL (ref 30.0–36.0)
MCV: 86.9 fL (ref 80.0–100.0)
Platelets: 198 10*3/uL (ref 150–400)
RBC: 3.81 MIL/uL — ABNORMAL LOW (ref 4.22–5.81)
RDW: 13.6 % (ref 11.5–15.5)
WBC: 10.8 10*3/uL — ABNORMAL HIGH (ref 4.0–10.5)
nRBC: 0 % (ref 0.0–0.2)

## 2019-01-18 LAB — BASIC METABOLIC PANEL
Anion gap: 10 (ref 5–15)
BUN: 15 mg/dL (ref 6–20)
CO2: 25 mmol/L (ref 22–32)
Calcium: 8.5 mg/dL — ABNORMAL LOW (ref 8.9–10.3)
Chloride: 103 mmol/L (ref 98–111)
Creatinine, Ser: 0.68 mg/dL (ref 0.61–1.24)
GFR calc Af Amer: >60 mL/min (ref >60)
GFR calc non Af Amer: >60 mL/min (ref >60)
Glucose, Bld: 139 mg/dL — ABNORMAL HIGH (ref 70–99)
Potassium: 4.1 mmol/L (ref 3.5–5.1)
Sodium: 138 mmol/L (ref 135–145)

## 2019-01-18 MED ORDER — SODIUM CHLORIDE 0.9 % IV SOLN
INTRAVENOUS | Status: DC
  Administered 2019-01-18 – 2019-01-19 (×2): via INTRAVENOUS

## 2019-01-18 MED ORDER — SODIUM CHLORIDE 0.9 % IV BOLUS
500.0000 mL | Freq: Once | INTRAVENOUS | Status: AC
  Administered 2019-01-18: 500 mL via INTRAVENOUS

## 2019-01-18 MED ORDER — TRAMADOL HCL 50 MG PO TABS
50.0000 mg | ORAL_TABLET | Freq: Four times a day (QID) | ORAL | Status: DC | PRN
  Administered 2019-01-18: 50 mg via ORAL
  Administered 2019-01-19: 100 mg via ORAL
  Filled 2019-01-18: qty 1
  Filled 2019-01-18 (×3): qty 2

## 2019-01-18 MED ORDER — PROMETHAZINE HCL 25 MG PO TABS: 25.0000 mg | ORAL_TABLET | Freq: Four times a day (QID) | ORAL | Status: DC | PRN

## 2019-01-18 NOTE — Progress Notes (Signed)
Physical Therapy Treatment Patient Details Name: [name] MRN: [id number] DOB: [birth year] Today's Date: 01/18/2019    History of Present Illness Patient is 58 y.o. male s/p Lt TKA on 01/17/19 with PMH significant for HTN, HLD, and OA.    PT Comments    Pt very motivated to progress but ltd by dizziness with move to standing - BP provided to and recorded by RN.   Follow Up Recommendations  Follow surgeon's recommendation for DC plan and follow-up therapies     Equipment Recommendations  None recommended by PT    Recommendations for Other Services       Precautions / Restrictions Precautions Precautions: Fall Restrictions Weight Bearing Restrictions: No Other Position/Activity Restrictions: WBAT    Mobility  Bed Mobility Overal bed mobility: Needs Assistance Bed Mobility: Supine to Sit     Supine to sit: HOB elevated;Min guard     General bed mobility comments: min guard with cues for transfer, no assist required, pt using bed rails  Transfers Overall transfer level: Needs assistance Equipment used: Rolling walker (2 wheeled) Transfers: Sit to/from Stand Sit to Stand: Min guard         General transfer comment: cues for LE management and use of UEs to self assist  Ambulation/Gait Ambulation/Gait assistance: Min assist Gait Distance (Feet): 5 Feet Assistive device: Rolling walker (2 wheeled) Gait Pattern/deviations: Step-to pattern;Drifts right/left;Decreased stance time - left;Decreased stride length;Scissoring;Narrow base of support Gait velocity: decreased   General Gait Details: pt limited to short distance from bed to chair 2* dizziness   Stairs             Wheelchair Mobility    Modified Rankin (Stroke Patients Only)       Balance Overall balance assessment: Needs assistance Sitting-balance support: No upper extremity supported;Feet supported Sitting balance-Leahy Scale: Good     Standing balance support: During functional  activity;Bilateral upper extremity supported Standing balance-Leahy Scale: Poor                              Cognition Arousal/Alertness: Awake/alert Behavior During Therapy: WFL for tasks assessed/performed Overall Cognitive Status: Within Functional Limits for tasks assessed                                        Exercises Total Joint Exercises Ankle Circles/Pumps: AROM;Both;15 reps;Supine Quad Sets: AROM;10 reps;Supine;Left Heel Slides: AAROM;Left;15 reps;Supine Straight Leg Raises: AAROM;AROM;Left;15 reps;Supine Goniometric ROM: AAROM L knee -8 - 45    General Comments        Pertinent Vitals/Pain Pain Assessment: 0-10 Pain Score: 4  Pain Location: L knee Pain Descriptors / Indicators: Aching;Sore Pain Intervention(s): Limited activity within patient's tolerance;Monitored during session;Premedicated before session;Ice applied    Home Living                      Prior Function            PT Goals (current goals can now be found in the care plan section) Acute Rehab PT Goals Patient Stated Goal: to return home and get back to independent mobility PT Goal Formulation: With patient Time For Goal Achievement: 01/24/19 Potential to Achieve Goals: Good Progress towards PT goals: Not progressing toward goals - comment(orthostatic)    Frequency    7X/week      PT Plan Current plan  remains appropriate    Co-evaluation              AM-PAC PT "6 Clicks" Mobility   Outcome Measure  Help needed turning from your back to your side while in a flat bed without using bedrails?: A Little Help needed moving from lying on your back to sitting on the side of a flat bed without using bedrails?: A Little Help needed moving to and from a bed to a chair (including a wheelchair)?: A Little Help needed standing up from a chair using your arms (e.g., wheelchair or bedside chair)?: A Little Help needed to walk in hospital room?: A  Little Help needed climbing 3-5 steps with a railing? : A Lot 6 Click Score: 17    End of Session Equipment Utilized During Treatment: Gait belt Activity Tolerance: Other (comment)(orthostatic) Patient left: in chair;with call bell/phone within reach;with chair alarm set Nurse Communication: Mobility status PT Visit Diagnosis: Other abnormalities of gait and mobility (R26.89);Muscle weakness (generalized) (M62.81);Difficulty in walking, not elsewhere classified (R26.2)     Time: [id number] PT Time Calculation (min) (ACUTE ONLY): 32 min  Charges:  $Gait Training: 8-22 mins $Therapeutic Exercise: 8-22 mins                     [place] Pager [phone] Office [phone]    Clayten Allcock 01/18/2019, 3:10 PM

## 2019-01-18 NOTE — Progress Notes (Signed)
Informed by physical therapist that the patient experiences dizziness and BP fluctuations when standing. BP provided and recorded. Updated PA and carried out new orders. Will continue to monitor patients progress.

## 2019-01-18 NOTE — Progress Notes (Signed)
Subjective: 1 Day Post-Op Procedure(s) (LRB): TOTAL KNEE ARTHROPLASTY (Left) Patient reports pain as 2 on 0-10 scale. Doing very well. No major pain. Hemovac DCd.   Objective: Vital signs in last 24 hours: Temp:  [97.4 F (36.3 C)-98.3 F (36.8 C)] 97.7 F (36.5 C) (10/08 0544) Pulse Rate:  [50-66] 64 (10/08 0544) Resp:  [13-18] 16 (10/08 0544) BP: (100-111)/(61-71) 106/69 (10/08 0544) SpO2:  [96 %-100 %] 99 % (10/08 0544) Weight:  [88.9 kg] 88.9 kg (10/07 0728)  Intake/Output from previous day: 10/07 0701 - 10/08 0700 In: 3399.7 [P.O.:720; I.V.:2579.7; IV Piggyback:100] Out: 4500 [Urine:4170; Drains:280; Blood:50] Intake/Output this shift: No intake/output data recorded.  Recent Labs    01/18/19 0245  HGB 10.7*   Recent Labs    01/18/19 0245  WBC 10.8*  RBC 3.81*  HCT 33.1*  PLT 198   Recent Labs    01/18/19 0245  NA 138  K 4.1  CL 103  CO2 25  BUN 15  CREATININE 0.68  GLUCOSE 139*  CALCIUM 8.5*   No results for input(s): LABPT, INR in the last 72 hours.  Neurologically intact Dorsiflexion/Plantar flexion intact No cellulitis present   Assessment/Plan: 1 Day Post-Op Procedure(s) (LRB): TOTAL KNEE ARTHROPLASTY (Left) Up with therapy   Anticipated LOS equal to or greater than 2 midnights due to - Age 59 and older with one or more of the following:  - Obesity  - Expected need for hospital services (PT, OT, Nursing) required for safe  discharge  - Anticipated need for postoperative skilled nursing care or inpatient rehab  - Active co-morbidities: None OR   - Unanticipated findings during/Post Surgery: None  - Patient is a high risk of re-admission due to: Non-elective hospital admission within previous 6 months Plan on DC tomorrow.   Latanya Maudlin 01/18/2019, 7:20 AM

## 2019-01-18 NOTE — Progress Notes (Signed)
Physical Therapy Treatment Patient Details Name: [name] MRN: [id number] DOB: Sep 08, 1960 Today's Date: 01/18/2019    History of Present Illness Patient is 58 y.o. male s/p Lt TKA on 01/17/19 with PMH significant for HTN, HLD, and OA.    PT Comments    Pt continues motivated but ltd by ongoing orthostatic BP.  BPs provided to and recorded by RN with BP at 3 min standing 81/40.   Follow Up Recommendations  Follow surgeon's recommendation for DC plan and follow-up therapies     Equipment Recommendations  None recommended by PT    Recommendations for Other Services       Precautions / Restrictions Precautions Precautions: Fall Restrictions Weight Bearing Restrictions: No Other Position/Activity Restrictions: WBAT    Mobility  Bed Mobility Overal bed mobility: Needs Assistance Bed Mobility: Supine to Sit     Supine to sit: HOB elevated;Min guard     General bed mobility comments: Pt up in chair and requests back to same  Transfers Overall transfer level: Needs assistance Equipment used: Rolling walker (2 wheeled) Transfers: Sit to/from Stand Sit to Stand: Min guard         General transfer comment: cues for LE management and use of UEs to self assist  Ambulation/Gait Ambulation/Gait assistance: Min assist Gait Distance (Feet): 5 Feet Assistive device: Rolling walker (2 wheeled) Gait Pattern/deviations: Step-to pattern;Drifts right/left;Decreased stance time - left;Decreased stride length;Scissoring;Narrow base of support Gait velocity: decreased   General Gait Details: Pt stood only, ambulation not attempted 2* orthostatic BP   Stairs             Wheelchair Mobility    Modified Rankin (Stroke Patients Only)       Balance Overall balance assessment: Needs assistance Sitting-balance support: No upper extremity supported;Feet supported Sitting balance-Leahy Scale: Good     Standing balance support: During functional activity;Bilateral  upper extremity supported Standing balance-Leahy Scale: Poor Standing balance comment: requires support in standing                            Cognition Arousal/Alertness: Awake/alert Behavior During Therapy: WFL for tasks assessed/performed Overall Cognitive Status: Within Functional Limits for tasks assessed                                        Exercises Total Joint Exercises Ankle Circles/Pumps: AROM;Both;15 reps;Supine Quad Sets: AROM;10 reps;Supine;Left Heel Slides: AAROM;Left;15 reps;Supine Straight Leg Raises: AAROM;AROM;Left;15 reps;Supine Goniometric ROM: AAROM L knee -8 - 45    General Comments        Pertinent Vitals/Pain Pain Assessment: 0-10 Pain Score: 4  Pain Location: L knee Pain Descriptors / Indicators: Aching;Sore Pain Intervention(s): Limited activity within patient's tolerance;Monitored during session;Premedicated before session;Ice applied    Home Living                      Prior Function            PT Goals (current goals can now be found in the care plan section) Acute Rehab PT Goals Patient Stated Goal: to return home and get back to independent mobility PT Goal Formulation: With patient Time For Goal Achievement: 01/24/19 Potential to Achieve Goals: Good Progress towards PT goals: Not progressing toward goals - comment(orthostatic)    Frequency    7X/week      PT Plan  Current plan remains appropriate    Co-evaluation              AM-PAC PT "6 Clicks" Mobility   Outcome Measure  Help needed turning from your back to your side while in a flat bed without using bedrails?: A Little Help needed moving from lying on your back to sitting on the side of a flat bed without using bedrails?: A Little Help needed moving to and from a bed to a chair (including a wheelchair)?: A Little Help needed standing up from a chair using your arms (e.g., wheelchair or bedside chair)?: A Little Help needed  to walk in hospital room?: A Little Help needed climbing 3-5 steps with a railing? : A Lot 6 Click Score: 17    End of Session Equipment Utilized During Treatment: Gait belt Activity Tolerance: Other (comment) Patient left: in chair;with call bell/phone within reach;with chair alarm set Nurse Communication: Mobility status PT Visit Diagnosis: Other abnormalities of gait and mobility (R26.89);Muscle weakness (generalized) (M62.81);Difficulty in walking, not elsewhere classified (R26.2)     Time: [id number] PT Time Calculation (min) (ACUTE ONLY): 13 min  Charges:  $Gait Training: 8-22 mins $Therapeutic Exercise: 8-22 mins $Therapeutic Activity: 8-22 mins                     Debe Coder PT Acute Rehabilitation Services Pager [phone] Office [phone]     Elliannah Wayment 01/18/2019, 3:15 PM

## 2019-01-19 LAB — BASIC METABOLIC PANEL
Anion gap: 9 (ref 5–15)
BUN: 13 mg/dL (ref 6–20)
CO2: 25 mmol/L (ref 22–32)
Calcium: 8 mg/dL — ABNORMAL LOW (ref 8.9–10.3)
Chloride: 102 mmol/L (ref 98–111)
Creatinine, Ser: 0.73 mg/dL (ref 0.61–1.24)
GFR calc Af Amer: >60 mL/min (ref >60)
GFR calc non Af Amer: >60 mL/min (ref >60)
Glucose, Bld: 135 mg/dL — ABNORMAL HIGH (ref 70–99)
Potassium: 3.6 mmol/L (ref 3.5–5.1)
Sodium: 136 mmol/L (ref 135–145)

## 2019-01-19 LAB — CBC
HCT: 31.1 % — ABNORMAL LOW (ref 39.0–52.0)
Hemoglobin: 10 g/dL — ABNORMAL LOW (ref 13.0–17.0)
MCH: 28.3 pg (ref 26.0–34.0)
MCHC: 32.2 g/dL (ref 30.0–36.0)
MCV: 88.1 fL (ref 80.0–100.0)
Platelets: 157 10*3/uL (ref 150–400)
RBC: 3.53 MIL/uL — ABNORMAL LOW (ref 4.22–5.81)
RDW: 14 % (ref 11.5–15.5)
WBC: 9.4 10*3/uL (ref 4.0–10.5)
nRBC: 0 % (ref 0.0–0.2)

## 2019-01-19 MED ORDER — OXYCODONE HCL 5 MG PO TABS: 5.0000 mg | ORAL_TABLET | Freq: Four times a day (QID) | ORAL | 0 refills | Status: DC | PRN

## 2019-01-19 MED ORDER — ASPIRIN EC 325 MG PO TBEC: 325.0000 mg | DELAYED_RELEASE_TABLET | Freq: Two times a day (BID) | ORAL | 0 refills | Status: DC

## 2019-01-19 MED ORDER — TRAMADOL HCL 50 MG PO TABS: 50.0000 mg | ORAL_TABLET | Freq: Four times a day (QID) | ORAL | 0 refills | Status: DC | PRN

## 2019-01-19 MED ORDER — PROMETHAZINE HCL 25 MG PO TABS: 25.0000 mg | ORAL_TABLET | Freq: Four times a day (QID) | ORAL | 0 refills | Status: DC | PRN

## 2019-01-19 MED ORDER — METHOCARBAMOL 500 MG PO TABS: 500.0000 mg | ORAL_TABLET | Freq: Four times a day (QID) | ORAL | 0 refills | Status: DC | PRN

## 2019-01-19 NOTE — Progress Notes (Addendum)
Physical Therapy Treatment Patient Details Name: [name] MRN: [id number] DOB: 1960-08-09 Today's Date: 01/19/2019    History of Present Illness Patient is 58 y.o. male s/p Lt TKA on 01/17/19 with PMH significant for HTN, HLD, and OA.    PT Comments    Pt with marked improvement in activity tolerance and no c/o dizziness.  Orthostatic BPs performed - no problem.   Follow Up Recommendations  Follow surgeon's recommendation for DC plan and follow-up therapies     Equipment Recommendations  None recommended by PT    Recommendations for Other Services       Precautions / Restrictions Precautions Precautions: Fall Restrictions Weight Bearing Restrictions: No Other Position/Activity Restrictions: WBAT    Mobility  Bed Mobility Overal bed mobility: Needs Assistance Bed Mobility: Supine to Sit     Supine to sit: Supervision     General bed mobility comments: INcreased time  Transfers Overall transfer level: Needs assistance Equipment used: Rolling walker (2 wheeled) Transfers: Sit to/from Stand Sit to Stand: Min guard;Supervision         General transfer comment: cues for LE management and use of UEs to self assist  Ambulation/Gait Ambulation/Gait assistance: Min guard Gait Distance (Feet): 120 Feet Assistive device: Rolling walker (2 wheeled) Gait Pattern/deviations: Step-to pattern;Decreased stance time - left;Decreased stride length Gait velocity: decreased   General Gait Details: Cues for posture, position from RW and sequence - no c/o dizziness   Stairs             Wheelchair Mobility    Modified Rankin (Stroke Patients Only)       Balance Overall balance assessment: Needs assistance Sitting-balance support: No upper extremity supported;Feet supported Sitting balance-Leahy Scale: Good       Standing balance-Leahy Scale: Fair                              Cognition Arousal/Alertness: Awake/alert Behavior During Therapy:  WFL for tasks assessed/performed Overall Cognitive Status: Within Functional Limits for tasks assessed                                        Exercises Total Joint Exercises Ankle Circles/Pumps: AROM;Both;15 reps;Supine Quad Sets: AROM;10 reps;Supine;Left Heel Slides: AAROM;Left;15 reps;Supine Straight Leg Raises: AAROM;AROM;Left;15 reps;Supine Goniometric ROM: AAROM L knee -8 - 50    General Comments        Pertinent Vitals/Pain Pain Assessment: 0-10 Pain Score: 5  Pain Location: L knee Pain Descriptors / Indicators: Aching;Sore Pain Intervention(s): Limited activity within patient's tolerance;Monitored during session;Premedicated before session;Ice applied    Home Living                      Prior Function            PT Goals (current goals can now be found in the care plan section) Acute Rehab PT Goals Patient Stated Goal: to return home and get back to independent mobility PT Goal Formulation: With patient Time For Goal Achievement: 01/24/19 Potential to Achieve Goals: Good Progress towards PT goals: Progressing toward goals    Frequency    7X/week      PT Plan Current plan remains appropriate    Co-evaluation              AM-PAC PT "6 Clicks" Mobility   Outcome Measure  Help needed turning from your back to your side while in a flat bed without using bedrails?: A Little Help needed moving from lying on your back to sitting on the side of a flat bed without using bedrails?: A Little Help needed moving to and from a bed to a chair (including a wheelchair)?: A Little Help needed standing up from a chair using your arms (e.g., wheelchair or bedside chair)?: A Little Help needed to walk in hospital room?: A Little Help needed climbing 3-5 steps with a railing? : A Lot 6 Click Score: 17    End of Session Equipment Utilized During Treatment: Gait belt Activity Tolerance: Patient tolerated treatment well Patient left: in  chair;with call bell/phone within reach Nurse Communication: Mobility status PT Visit Diagnosis: Other abnormalities of gait and mobility (R26.89);Muscle weakness (generalized) (M62.81);Difficulty in walking, not elsewhere classified (R26.2)     Time: [id number] PT Time Calculation (min) (ACUTE ONLY): 43 min  Charges:  $Gait Training: 23-37 mins $Therapeutic Exercise: 8-22 mins                     [place] Pager [phone] Office [phone]    Kyleah [place] 01/19/2019, 2:26 PM

## 2019-01-19 NOTE — Progress Notes (Signed)
   Subjective: 2 Days Post-Op Procedure(s) (LRB): TOTAL KNEE ARTHROPLASTY (Left) Patient reports pain as mild.   Patient seen in rounds for Dr. Gladstone Lighter. Patient is well, and has had no acute complaints or problems other than dizziness. Has had issues with orthostatic hypotension with PT. Voiding well. Positive flatus. No SOB or chest pain.  Plan is to go Home after hospital stay.  Objective: Vital signs in last 24 hours: Temp:  [98 F (36.7 C)-99.1 F (37.3 C)] 99.1 F (37.3 C) (10/09 0543) Pulse Rate:  [63-106] 87 (10/09 0543) Resp:  [14-18] 16 (10/09 0543) BP: (81-130)/(40-108) 125/76 (10/09 0543) SpO2:  [94 %-100 %] 94 % (10/09 0543)  Intake/Output from previous day:  Intake/Output Summary (Last 24 hours) at 01/19/2019 0719 Last data filed at 01/19/2019 0543 Gross per 24 hour  Intake 435 ml  Output 3170 ml  Net -2735 ml     Labs: Recent Labs    01/18/19 0245 01/19/19 0235  HGB 10.7* 10.0*   Recent Labs    01/18/19 0245 01/19/19 0235  WBC 10.8* 9.4  RBC 3.81* 3.53*  HCT 33.1* 31.1*  PLT 198 157   Recent Labs    01/18/19 0245 01/19/19 0235  NA 138 136  K 4.1 3.6  CL 103 102  CO2 25 25  BUN 15 13  CREATININE 0.68 0.73  GLUCOSE 139* 135*  CALCIUM 8.5* 8.0*    EXAM General - Patient is Alert and Oriented Extremity - Neurologically intact Intact pulses distally Dorsiflexion/Plantar flexion intact No cellulitis present Compartment soft Dressing/Incision - clean, dry, no drainage Motor Function - intact, moving foot and toes well on exam.   Past Medical History:  Diagnosis Date  . Arthritis    knees  . Hyperlipidemia   . Hypertension   . Pre-diabetes     Assessment/Plan: 2 Days Post-Op Procedure(s) (LRB): TOTAL KNEE ARTHROPLASTY (Left) Active Problems:   Loose left total knee arthroplasty (HCC)  Estimated body mass index is 28.12 kg/m as calculated from the following:   Height as of this encounter: 5\' 10"  (1.778 m).   Weight as of this  encounter: 88.9 kg. Advance diet Up with therapy D/C IV fluids after first session of therapy if no issue with orthostatic hypertension  DVT Prophylaxis - Xarelto Weight-Bearing as tolerated   Continue to hold BP. Will wait to see how he responds to PT to decide whether or not to give another bolus or continue fluids. Hopeful for DC home today. Will give parameters for patient for taking BP meds once he gets home. Follow up in 2 weeks. Discharge instructions given.   Ardeen Jourdain, PA-C Orthopaedic Surgery 01/19/2019, 7:19 AM

## 2019-01-19 NOTE — Progress Notes (Signed)
Pt was provided with d/c instruction. After discussing the pt's plan of care upon d/c home, the pt reported no further questions or concerns.

## 2019-01-19 NOTE — Progress Notes (Signed)
Physical Therapy Treatment Patient Details Name: [name] MRN: [id number] DOB: May 16, 1960 Today's Date: 01/19/2019    History of Present Illness Patient is 58 y.o. male s/p Lt TKA on 01/17/19 with PMH significant for HTN, HLD, and OA.    PT Comments    Pt continues to progress with mobility and eager for dc home.  This pm pt negotiated stairs and reviewed home therex program with written instruction provided and reviewed.   Follow Up Recommendations  Follow surgeon's recommendation for DC plan and follow-up therapies     Equipment Recommendations  None recommended by PT    Recommendations for Other Services       Precautions / Restrictions Precautions Precautions: Fall Restrictions Weight Bearing Restrictions: No Other Position/Activity Restrictions: WBAT    Mobility  Bed Mobility Overal bed mobility: Needs Assistance Bed Mobility: Supine to Sit     Supine to sit: Supervision     General bed mobility comments: INcreased time  Transfers Overall transfer level: Needs assistance Equipment used: Rolling walker (2 wheeled) Transfers: Sit to/from Stand Sit to Stand: Supervision         General transfer comment: min cues for LE management and use of UEs to self assist  Ambulation/Gait Ambulation/Gait assistance: Min guard;Supervision Gait Distance (Feet): 85 Feet Assistive device: Rolling walker (2 wheeled) Gait Pattern/deviations: Step-to pattern;Decreased stance time - left;Decreased stride length;Shuffle;Trunk flexed Gait velocity: decreased   General Gait Details: Cues for posture, position from RW and sequence - no c/o dizziness   Stairs Stairs: Yes Stairs assistance: Min assist Stair Management: One rail Right;Step to pattern;Forwards;With crutches Number of Stairs: 3 General stair comments: cues for sequence and foot/crutch placement - pt declines to practise second time   Wheelchair Mobility    Modified Rankin (Stroke Patients Only)       Balance Overall balance assessment: Needs assistance Sitting-balance support: No upper extremity supported;Feet supported Sitting balance-Leahy Scale: Good       Standing balance-Leahy Scale: Fair                              Cognition Arousal/Alertness: Awake/alert Behavior During Therapy: WFL for tasks assessed/performed Overall Cognitive Status: Within Functional Limits for tasks assessed                                        Exercises Total Joint Exercises Ankle Circles/Pumps: AROM;Both;15 reps;Supine Quad Sets: AROM;10 reps;Supine;Left Short Arc Quad: AAROM;AROM;Both;10 reps;Supine Heel Slides: AAROM;Left;15 reps;Supine Straight Leg Raises: AAROM;AROM;Left;15 reps;Supine Knee Flexion: AAROM;AROM;Left;10 reps;Seated Goniometric ROM: AAROM L knee -8 - 50    General Comments        Pertinent Vitals/Pain Pain Assessment: 0-10 Pain Score: 5  Pain Location: L knee Pain Descriptors / Indicators: Aching;Sore Pain Intervention(s): Limited activity within patient's tolerance;Monitored during session;Premedicated before session;Ice applied    Home Living                      Prior Function            PT Goals (current goals can now be found in the care plan section) Acute Rehab PT Goals Patient Stated Goal: to return home and get back to independent mobility PT Goal Formulation: With patient Time For Goal Achievement: 01/24/19 Potential to Achieve Goals: Good Progress towards PT goals: Progressing toward goals  Frequency    7X/week      PT Plan Current plan remains appropriate    Co-evaluation              AM-PAC PT "6 Clicks" Mobility   Outcome Measure  Help needed turning from your back to your side while in a flat bed without using bedrails?: A Little Help needed moving from lying on your back to sitting on the side of a flat bed without using bedrails?: A Little Help needed moving to and from a bed to a  chair (including a wheelchair)?: A Little Help needed standing up from a chair using your arms (e.g., wheelchair or bedside chair)?: A Little Help needed to walk in hospital room?: A Little Help needed climbing 3-5 steps with a railing? : A Little 6 Click Score: 18    End of Session Equipment Utilized During Treatment: Gait belt Activity Tolerance: Patient tolerated treatment well Patient left: in chair;with call bell/phone within reach;with nursing/sitter in room Nurse Communication: Mobility status PT Visit Diagnosis: Difficulty in walking, not elsewhere classified (R26.2)     Time: [id number] PT Time Calculation (min) (ACUTE ONLY): 43 min  Charges:  $Gait Training: 8-22 mins $Therapeutic Exercise: 23-37 mins                     [place] Pager [phone] Office [phone]    Leyana Whidden 01/19/2019, 2:31 PM

## 2019-01-21 NOTE — Discharge Summary (Signed)
Physician Discharge Summary   Patient ID: [name] MRN: [id number] DOB/AGE: [birth year] 59 y.o.  Admit date: 01/17/2019 Discharge date: 01/19/2019  Primary Diagnosis: Primary osteoarthritis left knee   Admission Diagnoses:  Past Medical History:  Diagnosis Date   Arthritis    knees   Hyperlipidemia    Hypertension    Pre-diabetes    Discharge Diagnoses:   Active Problems:   Loose left total knee arthroplasty (HCC)  Estimated body mass index is 28.12 kg/m as calculated from the following:   Height as of this encounter: 5\' 10"  (1.778 m).   Weight as of this encounter: 88.9 kg.  Procedure:  Procedure(s) (LRB): TOTAL KNEE ARTHROPLASTY (Left)   Consults: None  HPI: [name], 58 y.o. male, has a history of pain and functional disability in the left knee due to arthritis and has failed non-surgical conservative treatments for greater than 12 weeks to includeNSAID's and/or analgesics, corticosteriod injections, viscosupplementation injections and activity modification.  Onset of symptoms was gradual, starting 5 years ago with gradually worsening course since that time. The patient noted prior procedures on the knee to include  arthroscopy and menisectomy on the left knee(s).  Patient currently rates pain in the left knee(s) at 7 out of 10 with activity. Patient has night pain, worsening of pain with activity and weight bearing, pain that interferes with activities of daily living, pain with passive range of motion, crepitus and joint swelling.  Patient has evidence of periarticular osteophytes and joint space narrowing by imaging studies. There is no active infection.  Laboratory Data: Admission on 01/17/2019, Discharged on 01/19/2019  Component Date Value Ref Range Status   WBC 01/18/2019 10.8* 4.0 - 10.5 K/uL Final   RBC 01/18/2019 3.81* 4.22 - 5.81 MIL/uL Final   Hemoglobin 01/18/2019 10.7* 13.0 - 17.0 g/dL Final   HCT 01/18/2019 33.1* 39.0 - 52.0 % Final   MCV  01/18/2019 86.9  80.0 - 100.0 fL Final   MCH 01/18/2019 28.1  26.0 - 34.0 pg Final   MCHC 01/18/2019 32.3  30.0 - 36.0 g/dL Final   RDW 01/18/2019 13.6  11.5 - 15.5 % Final   Platelets 01/18/2019 198  150 - 400 K/uL Final   nRBC 01/18/2019 0.0  0.0 - 0.2 % Final   Performed at [facility], [place] [address] [place], [place] [address]   Sodium 01/18/2019 138  135 - 145 mmol/L Final   Potassium 01/18/2019 4.1  3.5 - 5.1 mmol/L Final   Chloride 01/18/2019 103  98 - 111 mmol/L Final   CO2 01/18/2019 25  22 - 32 mmol/L Final   Glucose, Bld 01/18/2019 139* 70 - 99 mg/dL Final   BUN 01/18/2019 15  6 - 20 mg/dL Final   Creatinine, Ser 01/18/2019 0.68  0.61 - 1.24 mg/dL Final   Calcium 01/18/2019 8.5* 8.9 - 10.3 mg/dL Final   GFR calc non Af Amer 01/18/2019 >60  >60 mL/min Final   GFR calc Af Amer 01/18/2019 >60  >60 mL/min Final   Anion gap 01/18/2019 10  5 - 15 Final   Performed at [facility], [place] [address] [place], [place] [address]   WBC 01/19/2019 9.4  4.0 - 10.5 K/uL Final   RBC 01/19/2019 3.53* 4.22 - 5.81 MIL/uL Final   Hemoglobin 01/19/2019 10.0* 13.0 - 17.0 g/dL Final   HCT 01/19/2019 31.1* 39.0 - 52.0 % Final   MCV 01/19/2019 88.1  80.0 - 100.0 fL Final   [facility] 01/19/2019  28.3  26.0 - 34.0 pg Final   MCHC 01/19/2019 32.2  30.0 - 36.0 g/dL Final   RDW 01/19/2019 14.0  11.5 - 15.5 % Final   Platelets 01/19/2019 157  150 - 400 K/uL Final   nRBC 01/19/2019 0.0  0.0 - 0.2 % Final   Performed at [facility], [place] [address] [place], [place] [address]   Sodium 01/19/2019 136  135 - 145 mmol/L Final   Potassium 01/19/2019 3.6  3.5 - 5.1 mmol/L Final   Chloride 01/19/2019 102  98 - 111 mmol/L Final   CO2 01/19/2019 25  22 - 32 mmol/L Final   Glucose, Bld 01/19/2019 135* 70 - 99 mg/dL Final   BUN 01/19/2019 13  6 - 20 mg/dL Final   Creatinine, Ser 01/19/2019 0.73  0.61 - 1.24 mg/dL Final    Calcium 01/19/2019 8.0* 8.9 - 10.3 mg/dL Final   GFR calc non Af Amer 01/19/2019 >60  >60 mL/min Final   GFR calc Af Amer 01/19/2019 >60  >60 mL/min Final   Anion gap 01/19/2019 9  5 - 15 Final   Performed at [facility], [place] [address], [place], [place] [address]  Hospital Outpatient Visit on 01/13/2019  Component Date Value Ref Range Status   SARS-CoV-2, NAA 01/13/2019 NOT DETECTED  NOT DETECTED Final   Comment: (NOTE) This nucleic acid amplification test was developed and its performance characteristics determined by [employer]. Nucleic acid amplification tests include PCR and TMA. This test has not been FDA cleared or approved. This test has been authorized by FDA under an Emergency Use Authorization (EUA). This test is only authorized for the duration of time the declaration that circumstances exist justifying the authorization of the emergency use of in vitro diagnostic tests for detection of SARS-CoV-2 virus and/or diagnosis of COVID-19 infection under section 564(b)(1) of the Act, 21 U.S.C. [id number]) (1), unless the authorization is terminated or revoked sooner. When diagnostic testing is negative, the possibility of a false negative result should be considered in the context of a patient's recent exposures and the presence of clinical signs and symptoms consistent with COVID-19. An individual without symptoms of COVID- 19 and who is not shedding SARS-CoV-2 vi                          rus would expect to have a negative (not detected) result in this assay. Performed At: [facility] [place], [place] [id number] Rush Farmer MD [id number]    Coronavirus Source 01/13/2019 NASOPHARYNGEAL   Final   Performed at [facility], [place] [address], [place], [place] [address]  Hospital Outpatient Visit on 01/09/2019  Component Date Value Ref Range Status   aPTT 01/09/2019 32  24 - 36 seconds Final   Performed at  [facility], [place] [address], [place], [place] [address]   Prothrombin Time 01/09/2019 12.6  11.4 - 15.2 seconds Final   INR 01/09/2019 1.0  0.8 - 1.2 Final   Comment: (NOTE) INR goal varies based on device and disease states. Performed at [facility], [place] [address], [place], [place] [address]    ABO/RH(D) 01/09/2019 O POS   Final   Antibody Screen 01/09/2019 NEG   Final   Sample Expiration 01/09/2019 01/20/2019,2359   Final   Extend sample reason 01/09/2019    Final  Value:NO TRANSFUSIONS OR PREGNANCY IN THE PAST 3 MONTHS Performed at [place] [address] [place], [place] [address]    MRSA, PCR 01/09/2019 NEGATIVE  NEGATIVE Final   Staphylococcus aureus 01/09/2019 NEGATIVE  NEGATIVE Final   Comment: (NOTE) The Xpert SA Assay (FDA approved for NASAL specimens in patients 90 years of age and older), is one component of a comprehensive surveillance program. It is not intended to diagnose infection nor to guide or monitor treatment. Performed at [facility], [place] [address] [place], [place] [address]    ABO/RH(D) 01/09/2019    Final                   Value:O POS Performed at [facility], [place] [address] [place], [place] [address]       Hospital Course: [name] is a 58 y.o. who was admitted to [facility]. They were brought to the operating room on 01/17/2019 and underwent Procedure(s): TOTAL KNEE ARTHROPLASTY.  Patient tolerated the procedure well and was later transferred to the recovery room and then to the orthopaedic floor for postoperative care.  They were given PO and IV analgesics for pain control following their surgery.  They were given 24 hours of postoperative antibiotics of  Anti-infectives (From admission, onward)   Start     Dose/Rate Route Frequency Ordered Stop   01/17/19 1430  ceFAZolin (ANCEF) IVPB 1 g/50 mL premix     1  g 100 mL/hr over 30 Minutes Intravenous Every 6 hours 01/17/19 1131 01/17/19 2003   01/17/19 0913  polymyxin B 500,000 Units, bacitracin 50,000 Units in sodium chloride 0.9 % 500 mL irrigation  Status:  Discontinued       As needed 01/17/19 0914 01/17/19 1033   01/17/19 0715  ceFAZolin (ANCEF) IVPB 2g/100 mL premix     2 g 200 mL/hr over 30 Minutes Intravenous On call to O.R. 01/17/19 [id number] 01/17/19 [id number]     and started on DVT prophylaxis in the form of Xarelto.   PT and OT were ordered for total joint protocol.  Discharge planning consulted to help with postop disposition and equipment needs.  Patient had a fair night on the evening of surgery.  They started to get up OOB with therapy on day one. Had some difficulty with orthostatic hypotension. Bolus given and BP meds held. Hemovac drain was pulled without difficulty.  Continued to work with therapy into day two.  Dressing was changed on day two and the incision was clean and dry. The patient had progressed with therapy and meeting their goals.  Incision was healing well.  Patient was seen in rounds and was ready to go home. Transitioned to aspirin for DVT ppx at home. Given BP medication parameters for at home.    Diet: Cardiac diet Activity:WBAT Follow-up:in 2 weeks Disposition - Home Discharged Condition: stable   Discharge Instructions    Call MD / Call 911   Complete by: As directed    If you experience chest pain or shortness of breath, CALL 911 and be transported to the hospital emergency room.  If you develope a fever above 101 F, pus (white drainage) or increased drainage or redness at the wound, or calf pain, call your surgeon's office.   Constipation Prevention   Complete by: As directed    Drink plenty of fluids.  Prune juice may be helpful.  You may use a stool softener, such as Colace (over the counter)  100 mg twice a day.  Use MiraLax (over the counter) for constipation as needed.   Diet - low sodium heart healthy   Complete  by: As directed    Discharge instructions   Complete by: As directed    Dr. Latanya Maudlin [facility] [address], [place], [place] [address] [phone]  TOTAL KNEE REPLACEMENT POSTOPERATIVE DIRECTIONS  Knee Rehabilitation, Guidelines Following Surgery  Results after knee surgery are often greatly improved when you follow the exercise, range of motion and muscle strengthening exercises prescribed by your doctor. Safety measures are also important to protect the knee from further injury. Any time any of these exercises cause you to have increased pain or swelling in your knee joint, decrease the amount until you are comfortable again and slowly increase them. If you have problems or questions, call your caregiver or physical therapist for advice.   HOME CARE INSTRUCTIONS  Remove items at home which could result in a fall. This includes throw rugs or furniture in walking pathways.  ICE to the affected knee every three hours for 30 minutes at a time and then as needed for pain and swelling.  Continue to use ice on the knee for pain and swelling from surgery. You may notice swelling that will progress down to the foot and ankle.  This is normal after surgery.  Elevate the leg when you are not up walking on it.   Continue to use the breathing machine which will help keep your temperature down.  It is common for your temperature to cycle up and down following surgery, especially at night when you are not up moving around and exerting yourself.  The breathing machine keeps your lungs expanded and your temperature down. Do not place pillow under knee, focus on keeping the knee straight while resting  DIET You may resume your previous home diet once your are discharged from the hospital.  DRESSING / WOUND CARE / SHOWERING You may shower 3 days after surgery, but keep the wounds dry during showering.  You may use an occlusive plastic wrap (Press'n Seal for example), NO  SOAKING/SUBMERGING IN THE BATHTUB.  If the bandage gets wet, change with a clean dry gauze.  If the incision gets wet, pat the wound dry with a clean towel. You may start showering once you are discharged home but do not submerge the incision under water. Just pat the incision dry and apply a dry gauze dressing on daily. Change the surgical dressing daily and reapply a dry dressing each time.  ACTIVITY Walk with your walker as instructed. Use walker as long as suggested by your caregivers. Avoid periods of inactivity such as sitting longer than an hour when not asleep. This helps prevent blood clots.  You may resume a sexual relationship in one month or when given the OK by your doctor.  You may return to work once you are cleared by your doctor.  Do not drive a car for 6 weeks or until released by you surgeon.  Do not drive while taking narcotics.  WEIGHT BEARING Weight bearing as tolerated with assist device (walker, cane, etc) as directed, use it as long as suggested by your surgeon or therapist, typically at least 4-6 weeks.  POSTOPERATIVE CONSTIPATION PROTOCOL Constipation - defined medically as fewer than three stools per week and severe constipation as less than one stool per week.  One of the most common issues patients have following surgery is constipation.  Even if you have a  regular bowel pattern at home, your normal regimen is likely to be disrupted due to multiple reasons following surgery.  Combination of anesthesia, postoperative narcotics, change in appetite and fluid intake all can affect your bowels.  In order to avoid complications following surgery, here are some recommendations in order to help you during your recovery period.  Colace (docusate) - Pick up an over-the-counter form of Colace or another stool softener and take twice a day as long as you are requiring postoperative pain medications.  Take with a full glass of water daily.  If you experience loose stools or  diarrhea, hold the colace until you stool forms back up.  If your symptoms do not get better within 1 week or if they get worse, check with your doctor.  Dulcolax (bisacodyl) - Pick up over-the-counter and take as directed by the product packaging as needed to assist with the movement of your bowels.  Take with a full glass of water.  Use this product as needed if not relieved by Colace only.   MiraLax (polyethylene glycol) - Pick up over-the-counter to have on hand.  MiraLax is a solution that will increase the amount of water in your bowels to assist with bowel movements.  Take as directed and can mix with a glass of water, juice, soda, coffee, or tea.  Take if you go more than two days without a movement. Do not use MiraLax more than once per day. Call your doctor if you are still constipated or irregular after using this medication for 7 days in a row.  If you continue to have problems with postoperative constipation, please contact the office for further assistance and recommendations.  If you experience "the worst abdominal pain ever" or develop nausea or vomiting, please contact the office immediatly for further recommendations for treatment.  ITCHING  If you experience itching with your medications, try taking only a single pain pill, or even half a pain pill at a time.  You can also use Benadryl over the counter for itching or also to help with sleep.   TED HOSE STOCKINGS Wear the elastic stockings on both legs for three weeks following surgery during the day but you may remove then at night for sleeping.  MEDICATIONS See your medication summary on the "After Visit Summary" that the nursing staff will review with you prior to discharge.  You may have some home medications which will be placed on hold until you complete the course of blood thinner medication.  It is important for you to complete the blood thinner medication as prescribed by your surgeon.  Continue your approved medications as  instructed at time of discharge.  DO NOT TAKE BLOOD PRESSURE MEDICATION (COAZAAR) UNLESS BP IS OVER 130/90. TAKE BP 3X DAILY UNTIL IT REGULATES.   PRECAUTIONS If you experience chest pain or shortness of breath - call 911 immediately for transfer to the hospital emergency department.  If you develop a fever greater that 101 F, purulent drainage from wound, increased redness or drainage from wound, foul odor from the wound/dressing, or calf pain - CONTACT YOUR SURGEON.                                                   FOLLOW-UP APPOINTMENTS Make sure you keep all of your appointments after your operation with your surgeon and caregivers.  You should call the office at the above phone number and make an appointment for approximately two weeks after the date of your surgery or on the date instructed by your surgeon outlined in the "After Visit Summary".   RANGE OF MOTION AND STRENGTHENING EXERCISES  Rehabilitation of the knee is important following a knee injury or an operation. After just a few days of immobilization, the muscles of the thigh which control the knee become weakened and shrink (atrophy). Knee exercises are designed to build up the tone and strength of the thigh muscles and to improve knee motion. Often times heat used for twenty to thirty minutes before working out will loosen up your tissues and help with improving the range of motion but do not use heat for the first two weeks following surgery. These exercises can be done on a training (exercise) mat, on the floor, on a table or on a bed. Use what ever works the best and is most comfortable for you Knee exercises include:  Leg Lifts - While your knee is still immobilized in a splint or cast, you can do straight leg raises. Lift the leg to 60 degrees, hold for 3 sec, and slowly lower the leg. Repeat 10-20 times 2-3 times daily. Perform this exercise against resistance later as your knee gets better.  Quad and Hamstring Sets - Tighten up  the muscle on the front of the thigh (Quad) and hold for 5-10 sec. Repeat this 10-20 times hourly. Hamstring sets are done by pushing the foot backward against an object and holding for 5-10 sec. Repeat as with quad sets.  Leg Slides: Lying on your back, slowly slide your foot toward your buttocks, bending your knee up off the floor (only go as far as is comfortable). Then slowly slide your foot back down until your leg is flat on the floor again. Angel Wings: Lying on your back spread your legs to the side as far apart as you can without causing discomfort.  A rehabilitation program following serious knee injuries can speed recovery and prevent re-injury in the future due to weakened muscles. Contact your doctor or a physical therapist for more information on knee rehabilitation.   IF YOU ARE TRANSFERRED TO A SKILLED REHAB FACILITY If the patient is transferred to a skilled rehab facility following release from the hospital, a list of the current medications will be sent to the facility for the patient to continue.  When discharged from the skilled rehab facility, please have the facility set up the patient's [place] prior to being released. Also, the skilled facility will be responsible for providing the patient with their medications at time of release from the facility to include their pain medication, the muscle relaxants, and their blood thinner medication. If the patient is still at the rehab facility at time of the two week follow up appointment, the skilled rehab facility will also need to assist the patient in arranging follow up appointment in our office and any transportation needs.  MAKE SURE YOU:  Understand these instructions.  Get help right away if you are not doing well or get worse.    Pick up stool softner and laxative for home use following surgery while on pain medications. Do not submerge incision under water. Please use good hand washing techniques while  changing dressing each day. May shower starting three days after surgery. Please use a clean towel to pat the incision dry following showers. Continue to use ice for pain  and swelling after surgery. Do not use any lotions or creams on the incision until instructed by your surgeon.   Do not put a pillow under the knee. Place it under the heel.   Complete by: As directed    Increase activity slowly as tolerated   Complete by: As directed    TED hose   Complete by: As directed    Use stockings (TED hose) for 3 weeks..  You may remove them at night for sleeping.     Allergies as of 01/19/2019   No Known Allergies     Medication List    TAKE these medications   aspirin EC 325 MG tablet Take 1 tablet (325 mg total) by mouth 2 (two) times daily.   atorvastatin 10 MG tablet Commonly known as: LIPITOR Take 10 mg by mouth daily.   losartan 50 MG tablet Commonly known as: COZAAR Take 50 mg by mouth daily.   methocarbamol 500 MG tablet Commonly known as: ROBAXIN Take 1 tablet (500 mg total) by mouth every 6 (six) hours as needed for muscle spasms.   multivitamin tablet Take 1 tablet by mouth daily.   oxyCODONE 5 MG immediate release tablet Commonly known as: Oxy IR/ROXICODONE Take 1-2 tablets (5-10 mg total) by mouth every 6 (six) hours as needed for severe pain.   promethazine 25 MG tablet Commonly known as: PHENERGAN Take 1 tablet (25 mg total) by mouth every 6 (six) hours as needed for nausea.   traMADol 50 MG tablet Commonly known as: ULTRAM Take 1-2 tablets (50-100 mg total) by mouth every 6 (six) hours as needed for moderate pain.      Follow-up Information    Latanya Maudlin, MD. Schedule an appointment as soon as possible for a visit on 01/30/2019.   Specialty: Orthopedic Surgery Contact information: [address] [place] [address] [phone]           Signed: Ardeen Jourdain, PA-C Orthopaedic Surgery 01/21/2019, 8:08 AM

## 2019-01-24 IMAGING — MR MR KNEE*L* W/O CM
4 of 5 series · 12 of 40 positions shown · non-contrast
Comparison: None.

CLINICAL DATA: Left knee pain for 3 months. Popping and lateral
swelling.

EXAM:
MRI OF THE LEFT KNEE WITHOUT CONTRAST
TECHNIQUE: Multiplanar, multisequence MR imaging of the knee was performed. No
intravenous contrast was administered.

[Series 4: pd_tse_fs_tra · axial · 3.5mm · 0.21mm/px · z∈[-72,+24]mm · 3 of 33 slices shown]
[im 4/33]
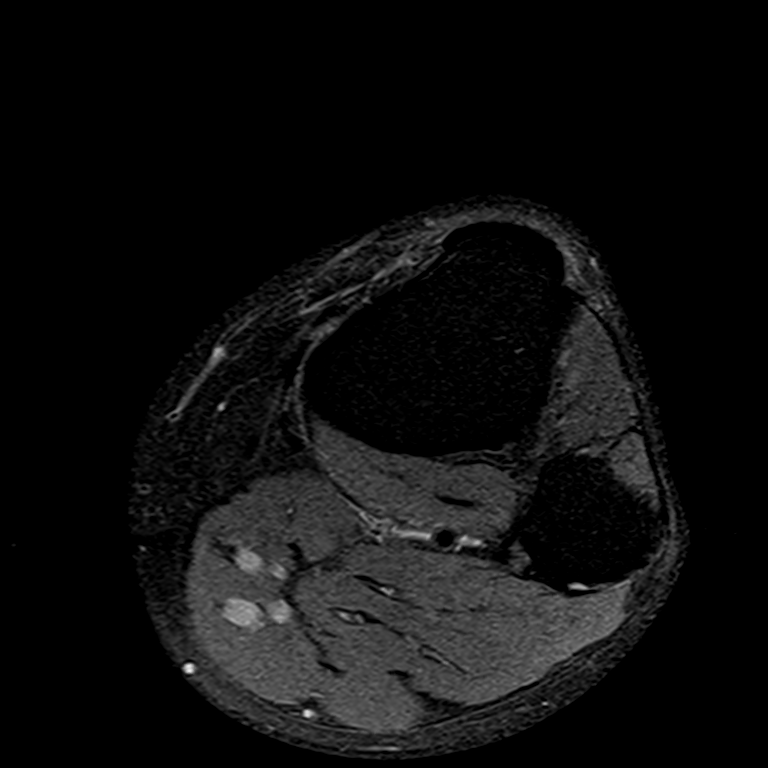
[im 18/33]
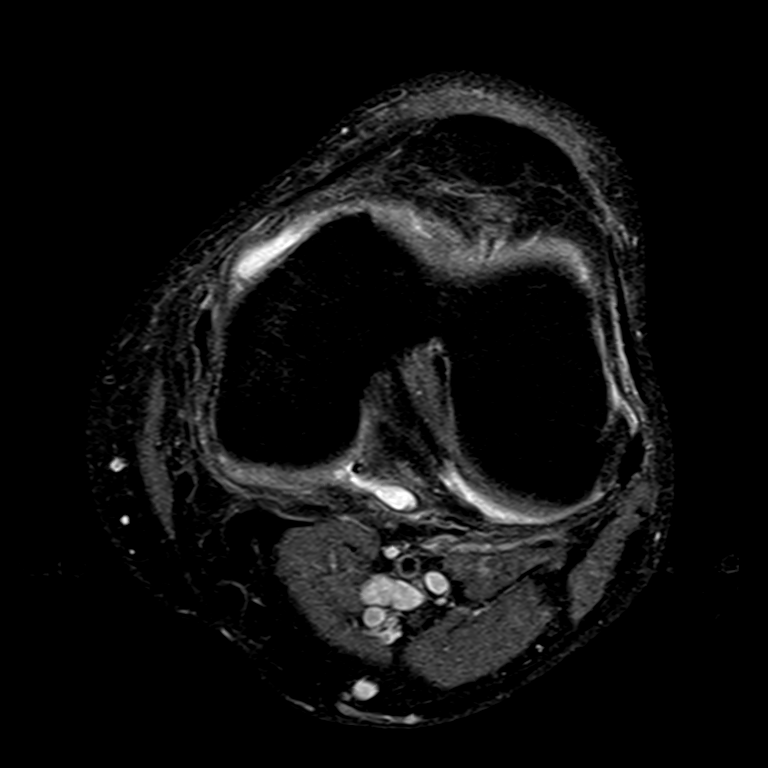
[im 29/33]
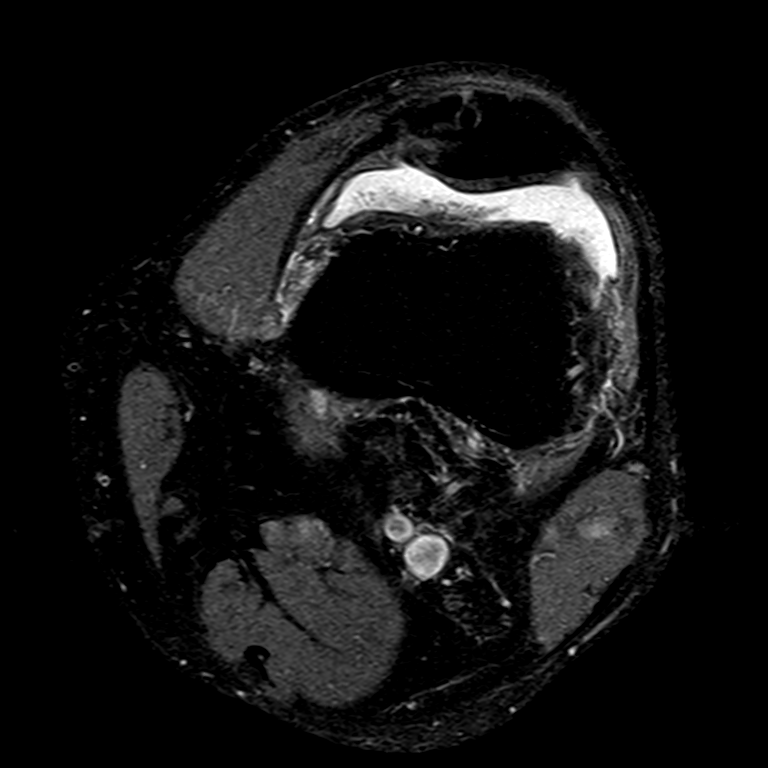

[Series 7: PD fat-sat · sagittal · 4.0mm · 0.21mm/px · 3 of 23 slices shown]
[im 4/23]
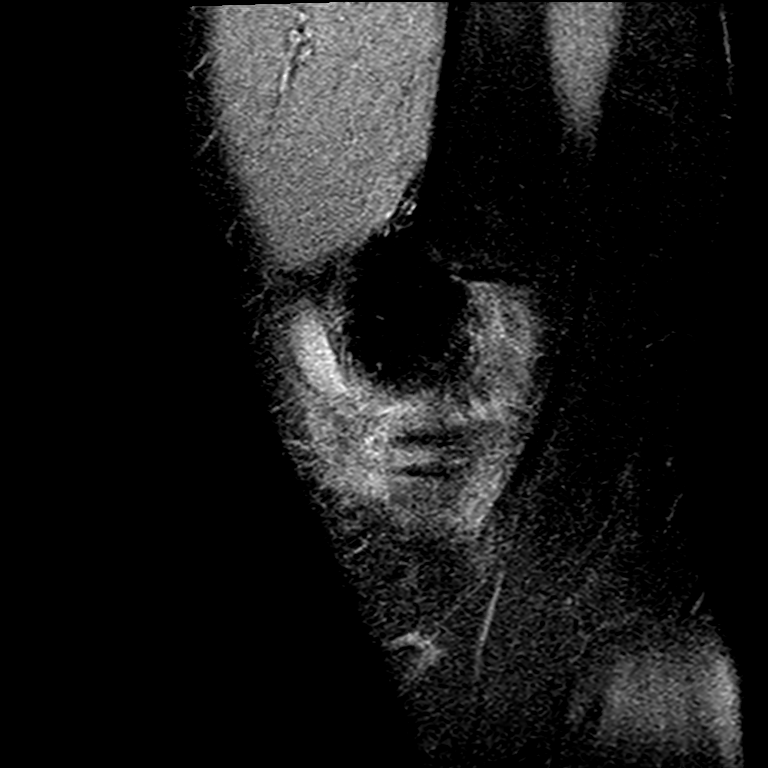
[im 12/23]
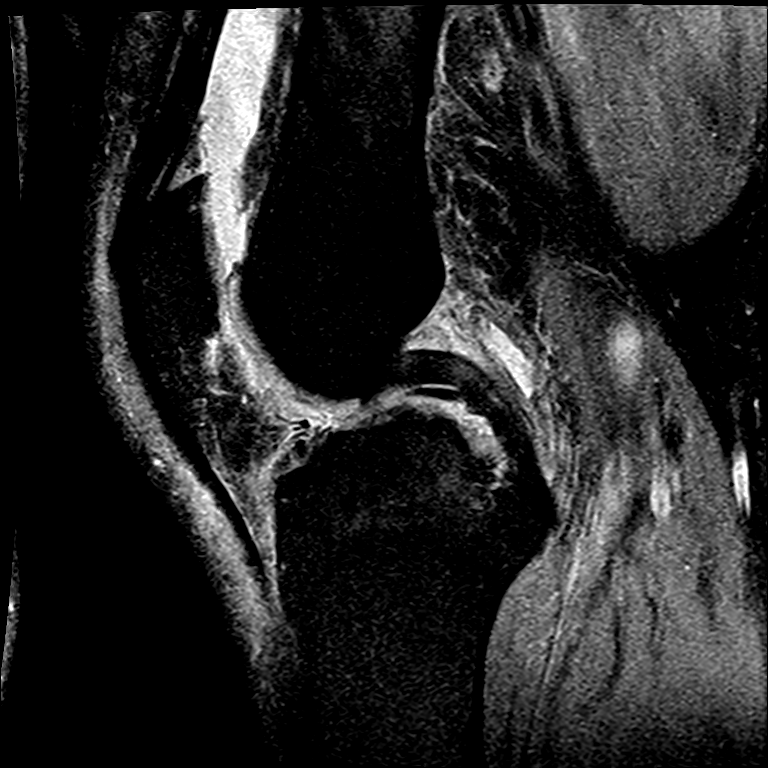
[im 19/23]
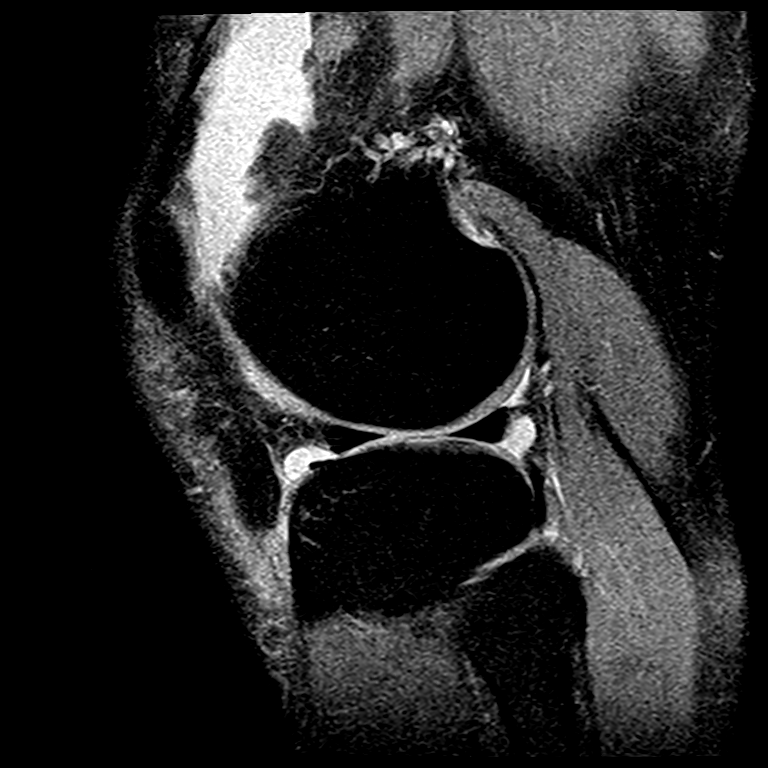

[Series 8: T1 · coronal · 3.0mm · 0.23mm/px · 3 of 25 slices shown]
[im 5/25]
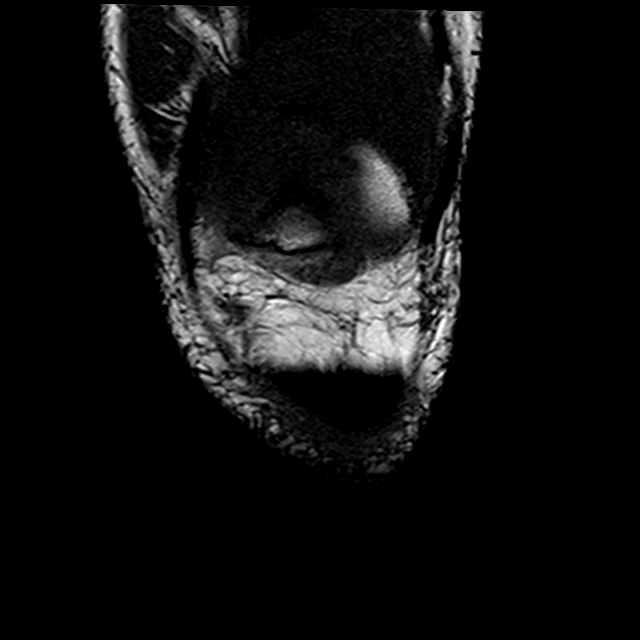
[im 13/25]
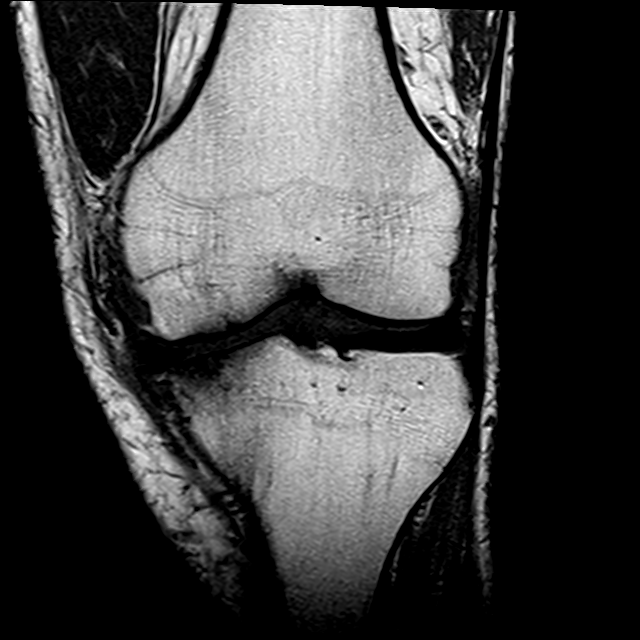
[im 21/25]
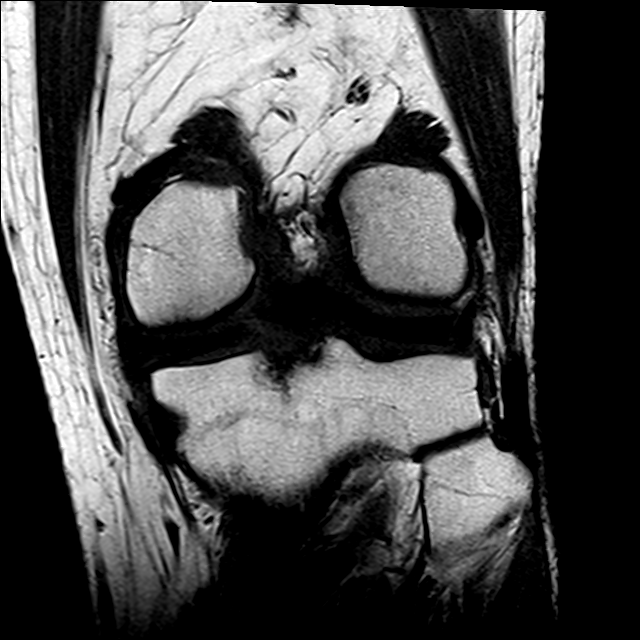

[Series 9: T2 fat-sat · coronal · 3.0mm · 0.47mm/px · 3 of 25 slices shown]
[im 5/25]
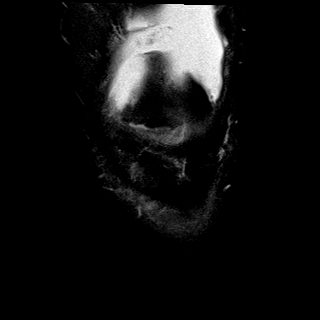
[im 13/25]
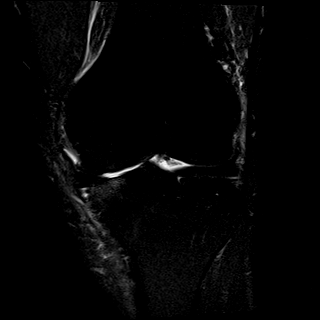
[im 21/25]
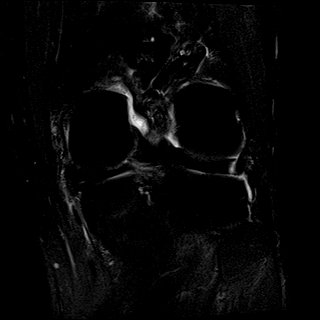

[12 of 40 positions shown; findings below may reference images not displayed]

FINDINGS: MENISCI

Medial meniscus: Large radial tear of the root of the posterior
horn. Adjacent grade 1 signal in the posterior horn and horizontal
grade 3 extension to the inferior surface of the midbody on image
[DATE].

Lateral meniscus:  Unremarkable

LIGAMENTS

Cruciates:  Unremarkable

Collaterals: Mild edema tracks adjacent to the MCL. This can be
incidental but in the appropriate clinical circumstance could
represent grade 1 sprain.

CARTILAGE

Patellofemoral: Chondral fissuring in the lateral patellar facet
near the posterior patellar ridge, image [DATE]. Moderate chondral
thinning inferiorly along the patella. Mild chondral thinning in the
femoral trochlear groove. Mild marginal spurring.

Medial: Severe chondral thinning. Degenerative subcortical foci of
edema along both surfaces.

Lateral:  Mild marginal spurring.

Joint:  Moderate to large knee effusion with mild synovitis.

Popliteal Fossa:  Unremarkable

Extensor Mechanism:  Unremarkable

Bones: No significant extra-articular osseous abnormalities
identified.

Other: No supplemental non-categorized findings.
IMPRESSION: 1. Large radial tear of the root of the posterior horn medial
meniscus. Grade 3 horizontal signal in the midbody medial meniscus
extending to the inferior surface.
2. Mild edema tracks adjacent to the MCL. This can be incidental but
in the appropriate clinical circumstance could represent grade 1
sprain.
3. Prominent chondral thinning in the medial compartment with
degenerative subcortical foci of edema. Variable but lesser degrees
of chondral thinning in the patellofemoral joint and lateral
compartment.
4. Small focus of chondral fissuring along the lateral patellar
facet.
5. Moderate to large knee effusion with mild synovitis.

## 2019-07-23 NOTE — H&P (Signed)
TOTAL KNEE ADMISSION H&P  Patient is being admitted for right total knee arthroplasty.  Subjective:  Chief Complaint:   Right knee primary OA / pain  HPI: [name], 59 y.o. male, has a history of pain and functional disability in the right knee due to arthritis and has failed non-surgical conservative treatments for greater than 12 weeks to include NSAID's and/or analgesics and activity modification.  Onset of symptoms was gradual, starting 1.5 years ago with gradually worsening course since that time. The patient noted prior procedures on the knee to include  arthroplasty on the left knee October 2020.  Patient currently rates pain in the right knee(s) at 8 out of 10 with activity. Patient has night pain, worsening of pain with activity and weight bearing, pain that interferes with activities of daily living, pain with passive range of motion, crepitus and joint swelling.  Patient has evidence of periarticular osteophytes and joint space narrowing by imaging studies.  There is no active infection.  Risks, benefits and expectations were discussed with the patient.  Risks including but not limited to the risk of anesthesia, blood clots, nerve damage, blood vessel damage, failure of the prosthesis, infection and up to and including death.  Patient understand the risks, benefits and expectations and wishes to proceed with surgery.   PCP: Hulan Fess, MD  D/C Plans:       Home (obs)  Post-op Meds:       No Rx given   Tranexamic Acid:      To be given - IV   Decadron:      Is to be given  FYI:      ASA  Oxycodone  Celebrex  DME:   Pt already has equipment  PT:   [place]: [place], [place]   Patient Active Problem List   Diagnosis Date Noted  . 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 Arthritis    knees  . Hyperlipidemia   . Hypertension   . Pre-diabetes     Past Surgical History:   Procedure Laterality Date  . COLONOSCOPY    . HEMORROIDECTOMY    . INGUINAL HERNIA REPAIR    . MENISCUS REPAIR Left   . POLYPECTOMY    . SIGMOIDOSCOPY  69yrs ago   Dr.Edwards  . TOTAL KNEE ARTHROPLASTY Left 01/17/2019   Procedure: TOTAL KNEE ARTHROPLASTY;  Surgeon: Latanya Maudlin, MD;  Location: [facility];  Service: Orthopedics;  Laterality: Left;  180min    Current Facility-Administered Medications  Medication Dose Route Frequency Provider Last Rate Last Admin  . 0.9 %  sodium chloride infusion  500 mL Intravenous Once Milus Banister, MD       Current Outpatient Medications  Medication Sig Dispense Refill Last Dose  . aspirin EC 325 MG tablet Take 1 tablet (325 mg total) by mouth 2 (two) times daily. 40 tablet 0   . atorvastatin (LIPITOR) 10 MG tablet Take 10 mg by mouth daily.      Marland Kitchen losartan (COZAAR) 50 MG tablet Take 50 mg by mouth daily.     . methocarbamol (ROBAXIN) 500 MG tablet Take 1 tablet (500 mg total) by mouth every 6 (six) hours as needed for muscle spasms. 40 tablet 0   . Multiple Vitamin (MULTIVITAMIN) tablet Take 1 tablet by mouth daily.       Marland Kitchen oxyCODONE (OXY IR/ROXICODONE) 5 MG immediate release tablet Take  1-2 tablets (5-10 mg total) by mouth every 6 (six) hours as needed for severe pain. 42 tablet 0   . promethazine (PHENERGAN) 25 MG tablet Take 1 tablet (25 mg total) by mouth every 6 (six) hours as needed for nausea. 20 tablet 0   . traMADol (ULTRAM) 50 MG tablet Take 1-2 tablets (50-100 mg total) by mouth every 6 (six) hours as needed for moderate pain. 40 tablet 0    No Known Allergies   Social History   Tobacco Use  . Smoking status: Former Research scientist (life sciences)  . Smokeless tobacco: Former Systems developer  . Tobacco comment: unknown quit date   Substance Use Topics  . Alcohol use: Not Currently    Alcohol/week: 18.0 standard drinks    Types: 18 Cans of beer per week    Comment: no etoh now x 3 yrs     Family History  Problem Relation Age of Onset  . Colon cancer Neg Hx   .  Colon polyps Neg Hx   . Esophageal cancer Neg Hx   . Rectal cancer Neg Hx   . Stomach cancer Neg Hx      Review of Systems  Constitutional: Negative.   HENT: Negative.   Eyes: Negative.   Respiratory: Negative.   Cardiovascular: Negative.   Gastrointestinal: Negative.   Genitourinary: Negative.   Musculoskeletal: Positive for joint pain.  Skin: Negative.   Neurological: Negative.   Endo/Heme/Allergies: Negative.   Psychiatric/Behavioral: Negative.      Objective:  Physical Exam  Constitutional: He is oriented to person, place, and time. He appears well-developed.  HENT:  Head: Normocephalic.  Eyes: Pupils are equal, round, and reactive to light.  Neck: No JVD present. No tracheal deviation present. No thyromegaly present.  Cardiovascular: Normal rate, regular rhythm and intact distal pulses.  Respiratory: Effort normal and breath sounds normal. No respiratory distress. He has no wheezes.  GI: Soft. There is no abdominal tenderness. There is no guarding.  Musculoskeletal:     Cervical back: Neck supple.     Right knee: Swelling and bony tenderness present. No deformity, erythema, ecchymosis or lacerations. Decreased range of motion. Tenderness present.  Lymphadenopathy:    He has no cervical adenopathy.  Neurological: He is alert and oriented to person, place, and time.  Skin: Skin is warm and dry.  Psychiatric: He has a normal mood and affect.     Labs:  Estimated body mass index is 28.12 kg/m as calculated from the following:   Height as of 01/17/19: 5\' 10"  (1.778 m).   Weight as of 01/17/19: 88.9 kg.   Imaging Review Plain radiographs demonstrate severe degenerative joint disease of the right knee.  The bone quality appears to be good for age and reported activity level.      Assessment/Plan:  End stage arthritis, right knee   The patient history, physical examination, clinical judgment of the provider and imaging studies are consistent with end stage  degenerative joint disease of the right knee(s) and total knee arthroplasty is deemed medically necessary. The treatment options including medical management, injection therapy arthroscopy and arthroplasty were discussed at length. The risks and benefits of total knee arthroplasty were presented and reviewed. The risks due to aseptic loosening, infection, stiffness, patella tracking problems, thromboembolic complications and other imponderables were discussed. The patient acknowledged the explanation, agreed to proceed with the plan and consent was signed. Patient is being admitted for inpatient treatment for surgery, pain control, PT, OT, prophylactic antibiotics, VTE prophylaxis, progressive ambulation and ADL's  and discharge planning. The patient is planning to be discharged home.     Patient's anticipated LOS is less than 2 midnights, meeting these requirements: - Younger than 19 - Lives within 1 hour of care - Has a competent adult at home to recover with post-op recover - NO history of  - Chronic pain requiring opiods  - Diabetes  - Coronary Artery Disease  - Heart failure  - Heart attack  - Stroke  - DVT/VTE  - Cardiac arrhythmia  - Respiratory Failure/COPD  - Renal failure  - Anemia  - Advanced Liver disease     [place] Pugh. Jadavion Spoelstra   PA-C  07/23/2019, 10:39 AM

## 2019-08-03 NOTE — Patient Instructions (Addendum)
DUE TO COVID-19 ONLY ONE VISITOR IS ALLOWED TO COME WITH YOU AND STAY IN THE WAITING ROOM ONLY DURING PRE OP AND PROCEDURE DAY OF SURGERY. THE 1 VISITOR MAY VISIT WITH YOU AFTER SURGERY IN YOUR PRIVATE ROOM DURING VISITING HOURS ONLY!  YOU NEED TO HAVE A COVID 19 TEST ON 08-10-19 @ 2:35 PM, THIS TEST MUST BE DONE BEFORE SURGERY, COME  [address], [place] , [address].  ([place]) ONCE YOUR COVID TEST IS COMPLETED, PLEASE BEGIN THE QUARANTINE INSTRUCTIONS AS OUTLINED IN YOUR HANDOUT.                JERIMIAH OSTERBERG  08/03/2019   Your procedure is scheduled on: 08-14-19   Report to [facility] Main  Entrance    Report to Short Stay at 5:30 AM    Call this number if you have problems the morning of surgery [phone]    Remember: NO SOLID FOOD AFTER MIDNIGHT THE NIGHT PRIOR TO SURGERY. NOTHING BY MOUTH EXCEPT CLEAR LIQUIDS UNTIL 4:15 AM . PLEASE FINISH ENSURE DRINK PER SURGEON ORDER  WHICH NEEDS TO BE COMPLETED AT 4:15 AM.   CLEAR LIQUID DIET   Foods Allowed                                                                     Foods Excluded  Coffee and tea, regular and decaf                             liquids that you cannot  Plain Jell-O any favor except red or purple                                           see through such as: Fruit ices (not with fruit pulp)                                     milk, soups, orange juice  Iced Popsicles                                    All solid food Carbonated beverages, regular and diet                                    Cranberry, grape and apple juices Sports drinks like Gatorade Lightly seasoned clear broth or consume(fat free) Sugar, honey syrup   _____________________________________________________________________    Take these medicines the morning of surgery with A SIP OF WATER: Atorvastatin (Lipitor)    BRUSH YOUR TEETH MORNING OF SURGERY AND RINSE YOUR MOUTH OUT, NO CHEWING GUM CANDY OR MINTS.                              You may not have any metal on your body including hair pins and  piercings    Do not wear jewelry, cologne, lotions, powders or deodorant              Men may shave face and neck.   Do not bring valuables to the hospital. [place].  Contacts, dentures or bridgework may not be worn into surgery.      Patients discharged the day of surgery will not be allowed to drive home. IF YOU ARE HAVING SURGERY AND GOING HOME THE SAME DAY, YOU MUST HAVE AN ADULT TO DRIVE YOU HOME AND BE WITH YOU FOR 24 HOURS. YOU MAY GO HOME BY TAXI OR UBER OR ORTHERWISE, BUT AN ADULT MUST ACCOMPANY YOU HOME AND STAY WITH YOU FOR 24 HOURS.  Name and phone number of your driver:Sandra Lennon [phone]  Special Instructions: N/A              Please read over the following fact sheets you were given: _____________________________________________________________________             [facility] - Preparing for Surgery Before surgery, you can play an important role.  Because skin is not sterile, your skin needs to be as free of germs as possible.  You can reduce the number of germs on your skin by washing with CHG (chlorahexidine gluconate) soap before surgery.  CHG is an antiseptic cleaner which kills germs and bonds with the skin to continue killing germs even after washing. Please DO NOT use if you have an allergy to CHG or antibacterial soaps.  If your skin becomes reddened/irritated stop using the CHG and inform your nurse when you arrive at Short Stay. Do not shave (including legs and underarms) for at least 48 hours prior to the first CHG shower.  You may shave your face/neck. Please follow these instructions carefully:  1.  Shower with CHG Soap the night before surgery and the  morning of Surgery.  2.  If you choose to wash your hair, wash your hair first as usual with your  normal  shampoo.  3.  After you shampoo, rinse your  hair and body thoroughly to remove the  shampoo.                           4.  Use CHG as you would any other liquid soap.  You can apply chg directly  to the skin and wash                       Gently with a scrungie or clean washcloth.  5.  Apply the CHG Soap to your body ONLY FROM THE NECK DOWN.   Do not use on face/ open                           Wound or open sores. Avoid contact with eyes, ears mouth and genitals (private parts).                       Wash face,  Genitals (private parts) with your normal soap.             6.  Wash thoroughly, paying special attention to the area where your surgery  will be performed.  7.  Thoroughly rinse your body with warm water from the neck down.  8.  DO NOT shower/wash with your normal soap after using and rinsing off  the CHG Soap.                9.  Pat yourself dry with a clean towel.            10.  Wear clean pajamas.            11.  Place clean sheets on your bed the night of your first shower and do not  sleep with pets. Day of Surgery : Do not apply any lotions/deodorants the morning of surgery.  Please wear clean clothes to the hospital/surgery center.  FAILURE TO FOLLOW THESE INSTRUCTIONS MAY RESULT IN THE CANCELLATION OF YOUR SURGERY PATIENT SIGNATURE_________________________________  NURSE SIGNATURE__________________________________  ________________________________________________________________________   [name]  An incentive spirometer is a tool that can help keep your lungs clear and active. This tool measures how well you are filling your lungs with each breath. Taking long deep breaths may help reverse or decrease the chance of developing breathing (pulmonary) problems (especially infection) following:  A long period of time when you are unable to move or be active. BEFORE THE PROCEDURE   If the spirometer includes an indicator to show your best effort, your nurse or respiratory therapist will set it to a  desired goal.  If possible, sit up straight or lean slightly forward. Try not to slouch.  Hold the incentive spirometer in an upright position. INSTRUCTIONS FOR USE  1. Sit on the edge of your bed if possible, or sit up as far as you can in bed or on a chair. 2. Hold the incentive spirometer in an upright position. 3. Breathe out normally. 4. Place the mouthpiece in your mouth and seal your lips tightly around it. 5. Breathe in slowly and as deeply as possible, raising the piston or the ball toward the top of the column. 6. Hold your breath for 3-5 seconds or for as long as possible. Allow the piston or ball to fall to the bottom of the column. 7. Remove the mouthpiece from your mouth and breathe out normally. 8. Rest for a few seconds and repeat Steps 1 through 7 at least 10 times every 1-2 hours when you are awake. Take your time and take a few normal breaths between deep breaths. 9. The spirometer may include an indicator to show your best effort. Use the indicator as a goal to work toward during each repetition. 10. After each set of 10 deep breaths, practice coughing to be sure your lungs are clear. If you have an incision (the cut made at the time of surgery), support your incision when coughing by placing a pillow or rolled up towels firmly against it. Once you are able to get out of bed, walk around indoors and cough well. You may stop using the incentive spirometer when instructed by your caregiver.  RISKS AND COMPLICATIONS  Take your time so you do not get dizzy or light-headed.  If you are in pain, you may need to take or ask for pain medication before doing incentive spirometry. It is harder to take a deep breath if you are having pain. AFTER USE  Rest and breathe slowly and easily.  It can be helpful to keep track of a log of your progress. Your caregiver can provide you with a simple table to help with this. If you are using the spirometer at home, follow these  instructions: [place] IF:  You are having difficultly using the spirometer.  You have trouble using the spirometer as often as instructed.  Your pain medication is not giving enough relief while using the spirometer.  You develop fever of 100.5 F (38.1 C) or higher. SEEK IMMEDIATE MEDICAL CARE IF:   You cough up bloody sputum that had not been present before.  You develop fever of 102 F (38.9 C) or greater.  You develop worsening pain at or near the incision site. MAKE SURE YOU:   Understand these instructions.  Will watch your condition.  Will get help right away if you are not doing well or get worse. Document Released: 08/09/2006 Document Revised: 06/21/2011 Document Reviewed: 10/10/2006 [facility] Patient Information 2014 [place], [place].   ________________________________________________________________________

## 2019-08-03 NOTE — Progress Notes (Addendum)
PCP - Hulan Fess, MD w/ surgical clearance dated 07-19-19 Cardiologist -   Chest x-ray -  EKG - 07-19-19 on chart Stress Test -  ECHO -  Cardiac Cath -   Sleep Study -  CPAP -   HGA1C 6.6 on chart  Fasting Blood Sugar -  Checks Blood Sugar _____ times a day  Blood Thinner Instructions: Aspirin Instructions: Last Dose:  Anesthesia review:   Patient denies shortness of breath, fever, cough and chest pain at PAT appointment   Patient verbalized understanding of instructions that were given to them at the PAT appointment. Patient was also instructed that they will need to review over the PAT instructions again at home before surgery.

## 2019-08-07 ENCOUNTER — Encounter (HOSPITAL_COMMUNITY): Payer: Self-pay

## 2019-08-07 ENCOUNTER — Other Ambulatory Visit: Payer: Self-pay

## 2019-08-07 ENCOUNTER — Encounter (HOSPITAL_COMMUNITY)
Admission: RE | Admit: 2019-08-07 | Discharge: 2019-08-07 | Disposition: A | Discharge status: Home / Self Care | Payer: PRIVATE HEALTH INSURANCE | Source: Ambulatory Visit | Attending: Orthopedic Surgery | Admitting: Orthopedic Surgery

## 2019-08-07 DIAGNOSIS — Z01812 Encounter for preprocedural laboratory examination: Secondary | ICD-10-CM | POA: Diagnosis present

## 2019-08-07 LAB — CBC WITH DIFFERENTIAL/PLATELET
Abs Immature Granulocytes: 0.01 10*3/uL (ref 0.00–0.07)
Basophils Absolute: 0 10*3/uL (ref 0.0–0.1)
Basophils Relative: 1 %
Eosinophils Absolute: 0.1 10*3/uL (ref 0.0–0.5)
Eosinophils Relative: 2 %
HCT: 41 % (ref 39.0–52.0)
Hemoglobin: 13.3 g/dL (ref 13.0–17.0)
Immature Granulocytes: 0 %
Lymphocytes Relative: 30 %
Lymphs Abs: 1.8 10*3/uL (ref 0.7–4.0)
MCH: 27.6 pg (ref 26.0–34.0)
MCHC: 32.4 g/dL (ref 30.0–36.0)
MCV: 85.1 fL (ref 80.0–100.0)
Monocytes Absolute: 0.4 10*3/uL (ref 0.1–1.0)
Monocytes Relative: 7 %
Neutro Abs: 3.7 10*3/uL (ref 1.7–7.7)
Neutrophils Relative %: 60 %
Platelets: 222 10*3/uL (ref 150–400)
RBC: 4.82 MIL/uL (ref 4.22–5.81)
RDW: 14.4 % (ref 11.5–15.5)
WBC: 6.1 10*3/uL (ref 4.0–10.5)
nRBC: 0 % (ref 0.0–0.2)

## 2019-08-07 LAB — COMPREHENSIVE METABOLIC PANEL
ALT: 21 U/L (ref 0–44)
AST: 22 U/L (ref 15–41)
Albumin: 4.4 g/dL (ref 3.5–5.0)
Alkaline Phosphatase: 74 U/L (ref 38–126)
Anion gap: 9 (ref 5–15)
BUN: 27 mg/dL — ABNORMAL HIGH (ref 6–20)
CO2: 27 mmol/L (ref 22–32)
Calcium: 9.3 mg/dL (ref 8.9–10.3)
Chloride: 103 mmol/L (ref 98–111)
Creatinine, Ser: 0.85 mg/dL (ref 0.61–1.24)
GFR calc Af Amer: >60 mL/min (ref >60)
GFR calc non Af Amer: >60 mL/min (ref >60)
Glucose, Bld: 98 mg/dL (ref 70–99)
Potassium: 4 mmol/L (ref 3.5–5.1)
Sodium: 139 mmol/L (ref 135–145)
Total Bilirubin: 0.8 mg/dL (ref 0.3–1.2)
Total Protein: 7.3 g/dL (ref 6.5–8.1)

## 2019-08-07 LAB — SURGICAL PCR SCREEN
MRSA, PCR: NEGATIVE
Staphylococcus aureus: NEGATIVE

## 2019-08-07 LAB — HEMOGLOBIN A1C
Hgb A1c MFr Bld: 6.2 % — ABNORMAL HIGH (ref 4.8–5.6)
Mean Plasma Glucose: 131.24 mg/dL

## 2019-08-07 LAB — PROTIME-INR
INR: 1 (ref 0.8–1.2)
Prothrombin Time: 12.7 seconds (ref 11.4–15.2)

## 2019-08-07 LAB — APTT: aPTT: 31 seconds (ref 24–36)

## 2019-08-10 ENCOUNTER — Other Ambulatory Visit (HOSPITAL_COMMUNITY)
Admission: RE | Admit: 2019-08-10 | Discharge: 2019-08-10 | Disposition: A | Discharge status: Home / Self Care | Payer: PRIVATE HEALTH INSURANCE | Source: Ambulatory Visit | Attending: Orthopedic Surgery | Admitting: Orthopedic Surgery

## 2019-08-10 DIAGNOSIS — Z20822 Contact with and (suspected) exposure to covid-19: Secondary | ICD-10-CM | POA: Diagnosis not present

## 2019-08-10 DIAGNOSIS — Z01812 Encounter for preprocedural laboratory examination: Secondary | ICD-10-CM | POA: Diagnosis present

## 2019-08-10 LAB — SARS CORONAVIRUS 2 (TAT 6-24 HRS): SARS Coronavirus 2: NEGATIVE

## 2019-08-13 NOTE — Anesthesia Preprocedure Evaluation (Addendum)
Anesthesia Evaluation  Patient identified by MRN, date of birth, ID band Patient awake    Reviewed: Allergy & Precautions, NPO status , Patient's Chart, lab work & pertinent test results  Airway Mallampati: III  TM Distance: >3 FB Neck ROM: Full    Dental no notable dental hx. (+) Teeth Intact, Dental Advisory Given,    Pulmonary former smoker,  No inhaler use    Pulmonary exam normal breath sounds clear to auscultation       Cardiovascular hypertension, Pt. on medications Normal cardiovascular exam Rhythm:Regular Rate:Normal  HLD   Neuro/Psych negative neurological ROS     GI/Hepatic negative GI ROS, Neg liver ROS,   Endo/Other  Pre-diabetic  Renal/GU negative Renal ROS  negative genitourinary   Musculoskeletal  (+) Arthritis , Osteoarthritis,    Abdominal Normal abdominal exam  (+)   Peds  Hematology negative hematology ROS (+) hct 41, plt 222   Anesthesia Other Findings Day of surgery medications reviewed with the patient.  Reproductive/Obstetrics negative OB ROS                           Anesthesia Physical Anesthesia Plan  ASA: II  Anesthesia Plan: Spinal, Regional and MAC   Post-op Pain Management:  Regional for Post-op pain   Induction:   PONV Risk Score and Plan: 2 and Propofol infusion and TIVA  Airway Management Planned: Natural Airway and Nasal Cannula  Additional Equipment: None  Intra-op Plan:   Post-operative Plan:   Informed Consent: I have reviewed the patients History and Physical, chart, labs and discussed the procedure including the risks, benefits and alternatives for the proposed anesthesia with the patient or authorized representative who has indicated his/her understanding and acceptance.       Plan Discussed with: CRNA  Anesthesia Plan Comments: (Had other knee replaced in October, did well with nerve block and spinal anesthetic. )       Anesthesia Quick Evaluation

## 2019-08-14 ENCOUNTER — Encounter (HOSPITAL_COMMUNITY)
Admission: RE | Disposition: A | Discharge status: Home / Self Care | Payer: Self-pay | Source: Home / Self Care | Attending: Orthopedic Surgery

## 2019-08-14 ENCOUNTER — Observation Stay (HOSPITAL_COMMUNITY)
Admission: RE | Admit: 2019-08-14 | Discharge: 2019-08-15 | Disposition: A | Discharge status: Home / Self Care | Payer: PRIVATE HEALTH INSURANCE | Attending: Orthopedic Surgery | Admitting: Orthopedic Surgery

## 2019-08-14 ENCOUNTER — Encounter (HOSPITAL_COMMUNITY): Payer: Self-pay | Admitting: Orthopedic Surgery

## 2019-08-14 ENCOUNTER — Ambulatory Visit (HOSPITAL_COMMUNITY): Payer: PRIVATE HEALTH INSURANCE | Admitting: Anesthesiology

## 2019-08-14 ENCOUNTER — Other Ambulatory Visit: Payer: Self-pay

## 2019-08-14 DIAGNOSIS — R7303 Prediabetes: Secondary | ICD-10-CM | POA: Insufficient documentation

## 2019-08-14 DIAGNOSIS — Z79899 Other long term (current) drug therapy: Secondary | ICD-10-CM | POA: Diagnosis not present

## 2019-08-14 DIAGNOSIS — Z96651 Presence of right artificial knee joint: Secondary | ICD-10-CM

## 2019-08-14 DIAGNOSIS — I10 Essential (primary) hypertension: Secondary | ICD-10-CM | POA: Diagnosis not present

## 2019-08-14 DIAGNOSIS — E785 Hyperlipidemia, unspecified: Secondary | ICD-10-CM | POA: Diagnosis not present

## 2019-08-14 DIAGNOSIS — E663 Overweight: Secondary | ICD-10-CM | POA: Diagnosis not present

## 2019-08-14 DIAGNOSIS — Z96652 Presence of left artificial knee joint: Secondary | ICD-10-CM | POA: Insufficient documentation

## 2019-08-14 DIAGNOSIS — Z6826 Body mass index (BMI) 26.0-26.9, adult: Secondary | ICD-10-CM | POA: Diagnosis not present

## 2019-08-14 DIAGNOSIS — M1711 Unilateral primary osteoarthritis, right knee: Secondary | ICD-10-CM | POA: Diagnosis present

## 2019-08-14 DIAGNOSIS — Z87891 Personal history of nicotine dependence: Secondary | ICD-10-CM | POA: Insufficient documentation

## 2019-08-14 DIAGNOSIS — Z7982 Long term (current) use of aspirin: Secondary | ICD-10-CM | POA: Diagnosis not present

## 2019-08-14 HISTORY — PX: TOTAL KNEE ARTHROPLASTY: SHX125

## 2019-08-14 LAB — TYPE AND SCREEN
ABO/RH(D): O POS
Antibody Screen: NEGATIVE

## 2019-08-14 SURGERY — ARTHROPLASTY, KNEE, TOTAL
Anesthesia: Monitor Anesthesia Care | Site: Knee | Laterality: Right

## 2019-08-14 MED ORDER — MIDAZOLAM HCL 2 MG/2ML IJ SOLN
INTRAMUSCULAR | Status: AC
  Filled 2019-08-14: qty 2

## 2019-08-14 MED ORDER — ACETAMINOPHEN 500 MG PO TABS
1000.0000 mg | ORAL_TABLET | Freq: Once | ORAL | Status: AC
  Administered 2019-08-14: 1000 mg via ORAL
  Filled 2019-08-14: qty 2

## 2019-08-14 MED ORDER — TRANEXAMIC ACID-NACL 1000-0.7 MG/100ML-% IV SOLN
1000.0000 mg | INTRAVENOUS | Status: AC
  Administered 2019-08-14: 1000 mg via INTRAVENOUS
  Filled 2019-08-14: qty 100

## 2019-08-14 MED ORDER — BUPIVACAINE HCL (PF) 0.25 % IJ SOLN
INTRAMUSCULAR | Status: DC | PRN
  Administered 2019-08-14: 30 mL

## 2019-08-14 MED ORDER — FENTANYL CITRATE (PF) 100 MCG/2ML IJ SOLN
INTRAMUSCULAR | Status: DC | PRN
  Administered 2019-08-14 (×2): 50 ug via INTRAVENOUS

## 2019-08-14 MED ORDER — HYDROMORPHONE HCL 1 MG/ML IJ SOLN: 0.5000 mg | INTRAMUSCULAR | Status: DC | PRN

## 2019-08-14 MED ORDER — ASPIRIN 81 MG PO CHEW
81.0000 mg | CHEWABLE_TABLET | Freq: Two times a day (BID) | ORAL | Status: DC
  Administered 2019-08-14 – 2019-08-15 (×2): 81 mg via ORAL
  Filled 2019-08-14 (×2): qty 1

## 2019-08-14 MED ORDER — DEXAMETHASONE SODIUM PHOSPHATE 10 MG/ML IJ SOLN
10.0000 mg | Freq: Once | INTRAMUSCULAR | Status: AC
  Administered 2019-08-15: 10 mg via INTRAVENOUS
  Filled 2019-08-14: qty 1

## 2019-08-14 MED ORDER — ONDANSETRON HCL 4 MG/2ML IJ SOLN: 4.0000 mg | Freq: Four times a day (QID) | INTRAMUSCULAR | Status: DC | PRN

## 2019-08-14 MED ORDER — LIDOCAINE 2% (20 MG/ML) 5 ML SYRINGE
INTRAMUSCULAR | Status: AC
  Filled 2019-08-14: qty 5

## 2019-08-14 MED ORDER — ACETAMINOPHEN 500 MG PO TABS
1000.0000 mg | ORAL_TABLET | Freq: Three times a day (TID) | ORAL | Status: DC
  Administered 2019-08-14 – 2019-08-15 (×3): 1000 mg via ORAL
  Filled 2019-08-14 (×3): qty 2

## 2019-08-14 MED ORDER — METOCLOPRAMIDE HCL 5 MG/ML IJ SOLN: 5.0000 mg | Freq: Three times a day (TID) | INTRAMUSCULAR | Status: DC | PRN

## 2019-08-14 MED ORDER — SODIUM CHLORIDE 0.9 % IV SOLN: INTRAVENOUS | Status: DC

## 2019-08-14 MED ORDER — LOSARTAN POTASSIUM 50 MG PO TABS: 50.0000 mg | ORAL_TABLET | Freq: Every day | ORAL | Status: DC

## 2019-08-14 MED ORDER — DOCUSATE SODIUM 100 MG PO CAPS
100.0000 mg | ORAL_CAPSULE | Freq: Two times a day (BID) | ORAL | Status: DC
  Administered 2019-08-14 (×2): 100 mg via ORAL
  Filled 2019-08-14 (×2): qty 1

## 2019-08-14 MED ORDER — CELECOXIB 200 MG PO CAPS
200.0000 mg | ORAL_CAPSULE | Freq: Two times a day (BID) | ORAL | Status: DC
  Administered 2019-08-14 (×2): 200 mg via ORAL
  Filled 2019-08-14 (×2): qty 1

## 2019-08-14 MED ORDER — CHLORHEXIDINE GLUCONATE 4 % EX LIQD: 60.0000 mL | Freq: Once | CUTANEOUS | Status: DC

## 2019-08-14 MED ORDER — MAGNESIUM CITRATE PO SOLN: 1.0000 | Freq: Once | ORAL | Status: DC | PRN

## 2019-08-14 MED ORDER — TRANEXAMIC ACID-NACL 1000-0.7 MG/100ML-% IV SOLN
1000.0000 mg | Freq: Once | INTRAVENOUS | Status: AC
  Administered 2019-08-14: 1000 mg via INTRAVENOUS
  Filled 2019-08-14: qty 100

## 2019-08-14 MED ORDER — METHOCARBAMOL 500 MG PO TABS
500.0000 mg | ORAL_TABLET | Freq: Four times a day (QID) | ORAL | Status: DC | PRN
  Administered 2019-08-14 (×2): 500 mg via ORAL
  Filled 2019-08-14 (×2): qty 1

## 2019-08-14 MED ORDER — PROPOFOL 10 MG/ML IV BOLUS
INTRAVENOUS | Status: DC | PRN
  Administered 2019-08-14: 20 mg via INTRAVENOUS
  Administered 2019-08-14: 30 mg via INTRAVENOUS

## 2019-08-14 MED ORDER — PROMETHAZINE HCL 25 MG/ML IJ SOLN: 6.2500 mg | INTRAMUSCULAR | Status: DC | PRN

## 2019-08-14 MED ORDER — ONDANSETRON HCL 4 MG PO TABS: 4.0000 mg | ORAL_TABLET | Freq: Four times a day (QID) | ORAL | Status: DC | PRN

## 2019-08-14 MED ORDER — PHENOL 1.4 % MT LIQD: 1.0000 | OROMUCOSAL | Status: DC | PRN

## 2019-08-14 MED ORDER — METHOCARBAMOL 500 MG IVPB - SIMPLE MED
500.0000 mg | Freq: Four times a day (QID) | INTRAVENOUS | Status: DC | PRN
  Filled 2019-08-14: qty 50

## 2019-08-14 MED ORDER — POVIDONE-IODINE 10 % EX SWAB
2.0000 "application " | Freq: Once | CUTANEOUS | Status: AC
  Administered 2019-08-14: 2 via TOPICAL

## 2019-08-14 MED ORDER — METOCLOPRAMIDE HCL 5 MG PO TABS: 5.0000 mg | ORAL_TABLET | Freq: Three times a day (TID) | ORAL | Status: DC | PRN

## 2019-08-14 MED ORDER — DIPHENHYDRAMINE HCL 12.5 MG/5ML PO ELIX: 12.5000 mg | ORAL_SOLUTION | ORAL | Status: DC | PRN

## 2019-08-14 MED ORDER — OXYCODONE HCL 5 MG PO TABS
5.0000 mg | ORAL_TABLET | ORAL | Status: DC | PRN
  Administered 2019-08-14 – 2019-08-15 (×3): 5 mg via ORAL
  Filled 2019-08-14 (×3): qty 1

## 2019-08-14 MED ORDER — STERILE WATER FOR IRRIGATION IR SOLN
Status: DC | PRN
  Administered 2019-08-14: 2000 mL

## 2019-08-14 MED ORDER — ONDANSETRON HCL 4 MG/2ML IJ SOLN
INTRAMUSCULAR | Status: AC
  Filled 2019-08-14: qty 2

## 2019-08-14 MED ORDER — POLYETHYLENE GLYCOL 3350 17 G PO PACK
17.0000 g | PACK | Freq: Two times a day (BID) | ORAL | Status: DC
  Administered 2019-08-14: 17 g via ORAL
  Filled 2019-08-14: qty 1

## 2019-08-14 MED ORDER — ATORVASTATIN CALCIUM 10 MG PO TABS: 10.0000 mg | ORAL_TABLET | Freq: Every day | ORAL | Status: DC

## 2019-08-14 MED ORDER — PROPOFOL 500 MG/50ML IV EMUL
INTRAVENOUS | Status: DC | PRN
  Administered 2019-08-14: 100 ug/kg/min via INTRAVENOUS

## 2019-08-14 MED ORDER — FENTANYL CITRATE (PF) 100 MCG/2ML IJ SOLN
INTRAMUSCULAR | Status: AC
  Filled 2019-08-14: qty 2

## 2019-08-14 MED ORDER — DEXAMETHASONE SODIUM PHOSPHATE 10 MG/ML IJ SOLN: 10.0000 mg | Freq: Once | INTRAMUSCULAR | Status: DC

## 2019-08-14 MED ORDER — SODIUM CHLORIDE 0.9 % IR SOLN
Status: DC | PRN
  Administered 2019-08-14: 1000 mL

## 2019-08-14 MED ORDER — OXYCODONE HCL 5 MG PO TABS: 10.0000 mg | ORAL_TABLET | ORAL | Status: DC | PRN

## 2019-08-14 MED ORDER — MENTHOL 3 MG MT LOZG: 1.0000 | LOZENGE | OROMUCOSAL | Status: DC | PRN

## 2019-08-14 MED ORDER — BUPIVACAINE HCL (PF) 0.25 % IJ SOLN
INTRAMUSCULAR | Status: AC
  Filled 2019-08-14: qty 30

## 2019-08-14 MED ORDER — MEPIVACAINE HCL (PF) 2 % IJ SOLN
INTRAMUSCULAR | Status: AC
  Filled 2019-08-14: qty 20

## 2019-08-14 MED ORDER — KETOROLAC TROMETHAMINE 30 MG/ML IJ SOLN
INTRAMUSCULAR | Status: DC | PRN
  Administered 2019-08-14: 30 mg

## 2019-08-14 MED ORDER — DEXAMETHASONE SODIUM PHOSPHATE 10 MG/ML IJ SOLN
INTRAMUSCULAR | Status: DC | PRN
  Administered 2019-08-14: 10 mg via INTRAVENOUS

## 2019-08-14 MED ORDER — OXYCODONE HCL 5 MG PO TABS: 5.0000 mg | ORAL_TABLET | Freq: Once | ORAL | Status: DC | PRN

## 2019-08-14 MED ORDER — MEPIVACAINE HCL (PF) 2 % IJ SOLN
INTRAMUSCULAR | Status: DC | PRN
  Administered 2019-08-14: 3 mL via EPIDURAL

## 2019-08-14 MED ORDER — ALUM & MAG HYDROXIDE-SIMETH 200-200-20 MG/5ML PO SUSP: 15.0000 mL | ORAL | Status: DC | PRN

## 2019-08-14 MED ORDER — OXYCODONE HCL 5 MG/5ML PO SOLN: 5.0000 mg | Freq: Once | ORAL | Status: DC | PRN

## 2019-08-14 MED ORDER — BISACODYL 10 MG RE SUPP: 10.0000 mg | Freq: Every day | RECTAL | Status: DC | PRN

## 2019-08-14 MED ORDER — PROPOFOL 1000 MG/100ML IV EMUL
INTRAVENOUS | Status: AC
  Filled 2019-08-14: qty 100

## 2019-08-14 MED ORDER — CEFAZOLIN SODIUM-DEXTROSE 2-4 GM/100ML-% IV SOLN
2.0000 g | INTRAVENOUS | Status: AC
  Administered 2019-08-14: 07:00:00 2 g via INTRAVENOUS
  Filled 2019-08-14: qty 100

## 2019-08-14 MED ORDER — MIDAZOLAM HCL 5 MG/5ML IJ SOLN
INTRAMUSCULAR | Status: DC | PRN
  Administered 2019-08-14: 2 mg via INTRAVENOUS

## 2019-08-14 MED ORDER — SODIUM CHLORIDE (PF) 0.9 % IJ SOLN
INTRAMUSCULAR | Status: AC
  Filled 2019-08-14: qty 50

## 2019-08-14 MED ORDER — ONDANSETRON HCL 4 MG/2ML IJ SOLN
INTRAMUSCULAR | Status: DC | PRN
  Administered 2019-08-14: 4 mg via INTRAVENOUS

## 2019-08-14 MED ORDER — KETOROLAC TROMETHAMINE 30 MG/ML IJ SOLN
INTRAMUSCULAR | Status: AC
  Filled 2019-08-14: qty 1

## 2019-08-14 MED ORDER — SODIUM CHLORIDE (PF) 0.9 % IJ SOLN
INTRAMUSCULAR | Status: DC | PRN
  Administered 2019-08-14: 30 mL

## 2019-08-14 MED ORDER — FERROUS SULFATE 325 (65 FE) MG PO TABS
325.0000 mg | ORAL_TABLET | Freq: Two times a day (BID) | ORAL | Status: DC
  Administered 2019-08-15: 325 mg via ORAL
  Filled 2019-08-14: qty 1

## 2019-08-14 MED ORDER — DEXAMETHASONE SODIUM PHOSPHATE 10 MG/ML IJ SOLN
INTRAMUSCULAR | Status: AC
  Filled 2019-08-14: qty 1

## 2019-08-14 MED ORDER — LACTATED RINGERS IV SOLN: INTRAVENOUS | Status: DC

## 2019-08-14 MED ORDER — HYDROMORPHONE HCL 1 MG/ML IJ SOLN: 0.2500 mg | INTRAMUSCULAR | Status: DC | PRN

## 2019-08-14 MED ORDER — CEFAZOLIN SODIUM-DEXTROSE 2-4 GM/100ML-% IV SOLN
2.0000 g | Freq: Four times a day (QID) | INTRAVENOUS | Status: AC
  Administered 2019-08-14 (×2): 2 g via INTRAVENOUS
  Filled 2019-08-14 (×2): qty 100

## 2019-08-14 SURGICAL SUPPLY — 65 items
ADH SKN CLS APL DERMABOND .7 (GAUZE/BANDAGES/DRESSINGS) ×1
ATTUNE MED ANAT PAT 41 KNEE (Knees) ×1 IMPLANT
ATTUNE MED ANAT PAT 41MM KNEE (Knees) ×1 IMPLANT
ATTUNE PS FEM RT SZ 6 CEM KNEE (Femur) ×2 IMPLANT
ATTUNE PSRP INSR SZ6 8 KNEE (Insert) ×1 IMPLANT
ATTUNE PSRP INSR SZ6 8MM KNEE (Insert) ×1 IMPLANT
BAG SPEC THK2 15X12 ZIP CLS (MISCELLANEOUS)
BAG ZIPLOCK 12X15 (MISCELLANEOUS) IMPLANT
BASE TIBIAL ROT PLAT SZ 7 KNEE (Knees) IMPLANT
BLADE SAW SGTL 11.0X1.19X90.0M (BLADE) IMPLANT
BLADE SAW SGTL 13.0X1.19X90.0M (BLADE) ×3 IMPLANT
BLADE SURG SZ10 CARB STEEL (BLADE) ×6 IMPLANT
BNDG ELASTIC 6X5.8 VLCR STR LF (GAUZE/BANDAGES/DRESSINGS) ×3 IMPLANT
BOWL SMART MIX CTS (DISPOSABLE) ×3 IMPLANT
BSPLAT TIB 7 CMNT ROT PLAT STR (Knees) ×1 IMPLANT
CEMENT HV SMART SET (Cement) ×4 IMPLANT
COVER SURGICAL LIGHT HANDLE (MISCELLANEOUS) ×3 IMPLANT
COVER WAND RF STERILE (DRAPES) IMPLANT
CUFF TOURN SGL QUICK 34 (TOURNIQUET CUFF) ×3
CUFF TRNQT CYL 34X4.125X (TOURNIQUET CUFF) ×1 IMPLANT
DECANTER SPIKE VIAL GLASS SM (MISCELLANEOUS) ×6 IMPLANT
DERMABOND ADVANCED (GAUZE/BANDAGES/DRESSINGS) ×2
DERMABOND ADVANCED .7 DNX12 (GAUZE/BANDAGES/DRESSINGS) ×1 IMPLANT
DRAPE U-SHAPE 47X51 STRL (DRAPES) ×3 IMPLANT
DRESSING AQUACEL AG SP 3.5X10 (GAUZE/BANDAGES/DRESSINGS) ×1 IMPLANT
DRSG AQUACEL AG SP 3.5X10 (GAUZE/BANDAGES/DRESSINGS) ×3
DURAPREP 26ML APPLICATOR (WOUND CARE) ×6 IMPLANT
ELECT REM PT RETURN 15FT ADLT (MISCELLANEOUS) ×3 IMPLANT
GLOVE BIO SURGEON STRL SZ 6 (GLOVE) ×3 IMPLANT
GLOVE BIOGEL PI IND STRL 6.5 (GLOVE) ×1 IMPLANT
GLOVE BIOGEL PI IND STRL 7.5 (GLOVE) ×1 IMPLANT
GLOVE BIOGEL PI IND STRL 8.5 (GLOVE) ×1 IMPLANT
GLOVE BIOGEL PI INDICATOR 6.5 (GLOVE) ×2
GLOVE BIOGEL PI INDICATOR 7.5 (GLOVE) ×2
GLOVE BIOGEL PI INDICATOR 8.5 (GLOVE)
GLOVE ECLIPSE 8.0 STRL XLNG CF (GLOVE) ×1 IMPLANT
GLOVE ORTHO TXT STRL SZ7.5 (GLOVE) ×3 IMPLANT
GOWN STRL REUS W/ TWL LRG LVL3 (GOWN DISPOSABLE) ×1 IMPLANT
GOWN STRL REUS W/TWL 2XL LVL3 (GOWN DISPOSABLE) ×3 IMPLANT
GOWN STRL REUS W/TWL LRG LVL3 (GOWN DISPOSABLE) ×6 IMPLANT
HANDPIECE INTERPULSE COAX TIP (DISPOSABLE) ×3
HOLDER FOLEY CATH W/STRAP (MISCELLANEOUS) IMPLANT
KIT TURNOVER KIT A (KITS) IMPLANT
MANIFOLD NEPTUNE II (INSTRUMENTS) ×3 IMPLANT
NDL SAFETY ECLIPSE 18X1.5 (NEEDLE) IMPLANT
NEEDLE HYPO 18GX1.5 SHARP (NEEDLE)
NS IRRIG 1000ML POUR BTL (IV SOLUTION) ×3 IMPLANT
PACK TOTAL KNEE CUSTOM (KITS) ×3 IMPLANT
PENCIL SMOKE EVACUATOR (MISCELLANEOUS) ×2 IMPLANT
PIN DRILL FIX HALF THREAD (BIT) ×2 IMPLANT
PIN FIX SIGMA LCS THRD HI (PIN) ×2 IMPLANT
PROTECTOR NERVE ULNAR (MISCELLANEOUS) ×3 IMPLANT
SET HNDPC FAN SPRY TIP SCT (DISPOSABLE) ×1 IMPLANT
SET PAD KNEE POSITIONER (MISCELLANEOUS) ×3 IMPLANT
SUT MNCRL AB 4-0 PS2 18 (SUTURE) ×3 IMPLANT
SUT STRATAFIX PDS+ 0 24IN (SUTURE) ×3 IMPLANT
SUT VIC AB 1 CT1 36 (SUTURE) ×3 IMPLANT
SUT VIC AB 2-0 CT1 27 (SUTURE) ×9
SUT VIC AB 2-0 CT1 TAPERPNT 27 (SUTURE) ×3 IMPLANT
SYR 3ML LL SCALE MARK (SYRINGE) ×3 IMPLANT
TIBIAL BASE ROT PLAT SZ 7 KNEE (Knees) ×3 IMPLANT
TRAY FOLEY MTR SLVR 16FR STAT (SET/KITS/TRAYS/PACK) ×3 IMPLANT
WATER STERILE IRR 1000ML POUR (IV SOLUTION) ×6 IMPLANT
WRAP KNEE MAXI GEL POST OP (GAUZE/BANDAGES/DRESSINGS) ×3 IMPLANT
YANKAUER SUCT BULB TIP 10FT TU (MISCELLANEOUS) ×3 IMPLANT

## 2019-08-14 NOTE — Transfer of Care (Signed)
Immediate Anesthesia Transfer of Care Note  Patient: [name]  Procedure(s) Performed: TOTAL KNEE ARTHROPLASTY (Right Knee)  Patient Location: PACU  Anesthesia Type:Spinal  Level of Consciousness: awake and alert   Airway & Oxygen Therapy: Patient Spontanous Breathing and Patient connected to face mask oxygen  Post-op Assessment: Report given to RN and Post -op Vital signs reviewed and stable  Post vital signs: Reviewed and stable  Last Vitals:  Vitals Value Taken Time  BP 121/78 08/14/19 0857  Temp    Pulse 51 08/14/19 0858  Resp 11 08/14/19 0858  SpO2 100 % 08/14/19 0858  Vitals shown include unvalidated device data.  Last Pain:  Vitals:   08/14/19 0605  TempSrc: Oral      Patients Stated Pain Goal: 4 ([phone] [id number])  Complications: No apparent anesthesia complications

## 2019-08-14 NOTE — Anesthesia Procedure Notes (Signed)
Spinal  Patient location during procedure: OR Start time: 08/14/2019 7:15 AM End time: 08/14/2019 7:20 AM Staffing Performed: anesthesiologist  Anesthesiologist: Pervis Hocking, DO Preanesthetic Checklist Completed: patient identified, IV checked, risks and benefits discussed, surgical consent, monitors and equipment checked, pre-op evaluation and timeout performed Spinal Block Patient position: sitting Prep: DuraPrep and site prepped and draped Patient monitoring: cardiac monitor, continuous pulse ox and blood pressure Approach: midline Location: L3-4 Injection technique: single-shot Needle Needle type: Pencan  Needle gauge: 24 G Needle length: 9 cm Assessment Sensory level: T6 Additional Notes Functioning IV was confirmed and monitors were applied. Sterile prep and drape, including hand hygiene and sterile gloves were used. The patient was positioned and the spine was prepped. The skin was anesthetized with lidocaine.  Free flow of clear CSF was obtained prior to injecting local anesthetic into the CSF.  The spinal needle aspirated freely following injection.  The needle was carefully withdrawn.  The patient tolerated the procedure well.

## 2019-08-14 NOTE — Anesthesia Postprocedure Evaluation (Signed)
Anesthesia Post Note  Patient: [name]  Procedure(s) Performed: TOTAL KNEE ARTHROPLASTY (Right Knee)     Patient location during evaluation: PACU Anesthesia Type: Regional, MAC and Spinal Level of consciousness: awake and alert Pain management: pain level controlled Vital Signs Assessment: post-procedure vital signs reviewed and stable Respiratory status: spontaneous breathing, nonlabored ventilation and respiratory function stable Cardiovascular status: blood pressure returned to baseline and stable Postop Assessment: no apparent nausea or vomiting Anesthetic complications: no    Last Vitals:  Vitals:   08/14/19 0605  BP: 127/79  Pulse: 61  Resp: 18  Temp: 36.7 C  SpO2: 99%    Last Pain:  Vitals:   08/14/19 6948  TempSrc: Oral                 Pervis Hocking

## 2019-08-14 NOTE — Interval H&P Note (Signed)
History and Physical Interval Note:  08/14/2019 6:58 AM  [name]  has presented today for surgery, with the diagnosis of Right knee osteoarthritis.  The various methods of treatment have been discussed with the patient and family. After consideration of risks, benefits and other options for treatment, the patient has consented to  Procedure(s) with comments: TOTAL KNEE ARTHROPLASTY (Right) - 70 mins as a surgical intervention.  The patient's history has been reviewed, patient examined, no change in status, stable for surgery.  I have reviewed the patient's chart and labs.  Questions were answered to the patient's satisfaction.     Mauri Pole

## 2019-08-14 NOTE — Anesthesia Procedure Notes (Signed)
Date/Time: 08/14/2019 6:37 AM Performed by: Sharlette Dense, CRNA Oxygen Delivery Method: Nasal cannula

## 2019-08-14 NOTE — Anesthesia Procedure Notes (Signed)
Date/Time: 08/14/2019 7:15 AM Performed by: Sharlette Dense, CRNA Oxygen Delivery Method: Simple face mask

## 2019-08-14 NOTE — Op Note (Signed)
NAME:  [name]                      MEDICAL RECORD NO.:  [id number]                             FACILITY:  [facility]      PHYSICIAN:  Pietro Cassis. Alvan Dame, M.D.  DATE OF BIRTH:  Nov 26, 1960      DATE OF PROCEDURE:  08/14/2019                                     OPERATIVE REPORT         PREOPERATIVE DIAGNOSIS:  Right knee osteoarthritis.      POSTOPERATIVE DIAGNOSIS:  Right knee osteoarthritis.      FINDINGS:  The patient was noted to have complete loss of cartilage and   bone-on-bone arthritis with associated osteophytes in the medial and patellofemoral compartments of   the knee with a significant synovitis and associated effusion.  The patient had failed months of conservative treatment including medications, injection therapy, activity modification.     PROCEDURE:  Right total knee replacement.      COMPONENTS USED:  DePuy Attune rotating platform posterior stabilized knee   system, a size 6 femur, 7 tibia, size 8 mm PS AOX insert, and 41 anatomic patellar   button.      SURGEON:  Pietro Cassis. Alvan Dame, M.D.      ASSISTANT:  Griffith Citron, PA-C.      ANESTHESIA:  Regional and Spinal.      SPECIMENS:  None.      COMPLICATION:  None.      DRAINS:  None.  EBL: <100cc      TOURNIQUET TIME:   Total Tourniquet Time Documented: Thigh (Right) - 31 minutes Total: Thigh (Right) - 31 minutes  .      The patient was stable to the recovery room.      INDICATION FOR PROCEDURE:  [name] is a 59 y.o. male patient of   mine.  The patient had been seen, evaluated, and treated for months conservatively in the   office with medication, activity modification, and injections.  The patient had   radiographic changes of bone-on-bone arthritis with endplate sclerosis and osteophytes noted.  Based on the radiographic changes and failed conservative measures, the patient   decided to proceed with definitive treatment, total knee replacement.  Risks of infection, DVT, component failure, need for  revision surgery, neurovascular injury were reviewed in the office setting.  The postop course was reviewed stressing the efforts to maximize post-operative satisfaction and function.  Consent was obtained for benefit of pain   relief.      PROCEDURE IN DETAIL:  The patient was brought to the operative theater.   Once adequate anesthesia, preoperative antibiotics, 2 gm of Ancef,1 gm of Tranexamic Acid, and 10 mg of Decadron administered, the patient was positioned supine with a right thigh tourniquet placed.  The  right lower extremity was prepped and draped in sterile fashion.  A time-   out was performed identifying the patient, planned procedure, and the appropriate extremity.      The right lower extremity was placed in the [facility] leg holder.  The leg was   exsanguinated, tourniquet elevated to 250 mmHg.  A midline incision was   made  followed by median parapatellar arthrotomy.  Following initial   exposure, attention was first directed to the patella.  Precut   measurement was noted to be 26 mm.  I resected down to 14 mm and used a   41 anatomic patellar button to restore patellar height as well as cover the cut surface.      The lug holes were drilled and a metal shim was placed to protect the   patella from retractors and saw blade during the procedure.      At this point, attention was now directed to the femur.  The femoral   canal was opened with a drill, irrigated to try to prevent fat emboli.  An   intramedullary rod was passed at 5 degrees valgus, 9 mm of bone was   resected off the distal femur.  Following this resection, the tibia was   subluxated anteriorly.  Using the extramedullary guide, 2 mm of bone was resected off   the proximal medial tibia.  We confirmed the gap would be   stable medially and laterally with a size 6 spacer block as well as confirmed that the tibial cut was perpendicular in the coronal plane, checking with an alignment rod.      Once this was done, I  sized the femur to be a size 6 in the anterior-   posterior dimension, chose a standard component based on medial and   lateral dimension.  The size 6 rotation block was then pinned in   position anterior referenced using the C-clamp to set rotation.  The   anterior, posterior, and  chamfer cuts were made without difficulty nor   notching making certain that I was along the anterior cortex to help   with flexion gap stability.      The final box cut was made off the lateral aspect of distal femur.      At this point, the tibia was sized to be a size 7.  The size 7 tray was   then pinned in position through the medial third of the tubercle,   drilled, and keel punched.  Trial reduction was now carried with a 6 femur,  7 tibia, a size 8 mm PS insert, and the 41 anatomic patella botton.  The knee was brought to full extension with good flexion stability with the patella   tracking through the trochlea without application of pressure.  Given   all these findings the trial components removed.  Final components were   opened and cement was mixed.  The knee was irrigated with normal saline solution and pulse lavage.  The synovial lining was   then injected with 30 cc of 0.25% Marcaine with epinephrine, 1 cc of Toradol and 30 cc of NS for a total of 61 cc.     Final implants were then cemented onto cleaned and dried cut surfaces of bone with the knee brought to extension with a size 8 mm PS trial insert.      Once the cement had fully cured, excess cement was removed   throughout the knee.  I confirmed that I was satisfied with the range of   motion and stability, and the final size 8 mm PS AOX insert was chosen.  It was   placed into the knee.      The tourniquet had been let down at 31 minutes.  No significant   hemostasis was required.  The extensor mechanism was then reapproximated using #1 Vicryl and #  1 Stratafix sutures with the knee   in flexion.  The   remaining wound was closed with 2-0  Vicryl and running 4-0 Monocryl.   The knee was cleaned, dried, dressed sterilely using Dermabond and   Aquacel dressing.  The patient was then   brought to recovery room in stable condition, tolerating the procedure   well.   Please note that Physician Assistant, Griffith Citron, PA-C was present for the entirety of the case, and was utilized for pre-operative positioning, peri-operative retractor management, general facilitation of the procedure and for primary wound closure at the end of the case.              Pietro Cassis Alvan Dame, M.D.    08/14/2019 8:38 AM

## 2019-08-14 NOTE — Evaluation (Signed)
Physical Therapy Evaluation Patient Details Name: [name] MRN: [id number] DOB: [birth year] Today's Date: 08/14/2019   History of Present Illness  R TKA; PMH L TKA 01/2019  Clinical Impression  Pt is s/p TKA resulting in the deficits listed below (see PT Problem List). Pt ambulated 78' with RW, no loss of balance. INitiated TKA HEP. Good progress expected.  Pt will benefit from skilled PT to increase their independence and safety with mobility to allow discharge to the venue listed below.      Follow Up Recommendations Outpatient PT;Follow surgeon's recommendation for DC plan and follow-up therapies    Equipment Recommendations  None recommended by PT    Recommendations for Other Services       Precautions / Restrictions Precautions Precautions: Knee Precaution Comments: reviewed no pillow under knee Restrictions Weight Bearing Restrictions: No      Mobility  Bed Mobility Overal bed mobility: Needs Assistance Bed Mobility: Supine to Sit     Supine to sit: Min assist     General bed mobility comments: min A to raise trunk  Transfers Overall transfer level: Needs assistance Equipment used: Rolling walker (2 wheeled) Transfers: Sit to/from Stand Sit to Stand: Min guard;From elevated surface         General transfer comment: VCs for hand placement  Ambulation/Gait Ambulation/Gait assistance: Min guard Gait Distance (Feet): 80 Feet Assistive device: Rolling walker (2 wheeled) Gait Pattern/deviations: Step-to pattern;Decreased step length - right;Decreased step length - left Gait velocity: decr   General Gait Details: VCs sequencing, no loss of balance  Stairs            Wheelchair Mobility    Modified Rankin (Stroke Patients Only)       Balance Overall balance assessment: Modified Independent                                           Pertinent Vitals/Pain Pain Assessment: 0-10 Pain Score: 3  Pain Location: R knee Pain  Descriptors / Indicators: Sore Pain Intervention(s): Limited activity within patient's tolerance;Monitored during session;Premedicated before session;Ice applied    Home Living Family/patient expects to be discharged to:: Private residence Living Arrangements: Spouse/significant other Available Help at Discharge: Family;Available 24 hours/day Type of Home: House Home Access: Stairs to enter Entrance Stairs-Rails: Right Entrance Stairs-Number of Steps: 3 Home Layout: One level Home Equipment: Walker - 2 wheels      Prior Function Level of Independence: Independent               Hand Dominance        Extremity/Trunk Assessment   Upper Extremity Assessment Upper Extremity Assessment: Overall WFL for tasks assessed    Lower Extremity Assessment Lower Extremity Assessment: RLE deficits/detail RLE Deficits / Details: SLR -3/5, knee AAROM 7-45* RLE Sensation: WNL RLE Coordination: WNL    Cervical / Trunk Assessment Cervical / Trunk Assessment: Normal  Communication   Communication: No difficulties  Cognition Arousal/Alertness: Awake/alert Behavior During Therapy: WFL for tasks assessed/performed Overall Cognitive Status: Within Functional Limits for tasks assessed                                        General Comments      Exercises Total Joint Exercises Ankle Circles/Pumps: PROM;Both;10 reps;Supine Quad Sets: AROM;Both;5 reps;Supine Heel Slides:  AAROM;Right;10 reps;Supine Long Arc Quad: AROM;Right;5 reps;Seated Goniometric ROM: 7-45* AAROM R knee   Assessment/Plan    PT Assessment Patient needs continued PT services  PT Problem List Decreased strength;Decreased range of motion;Decreased activity tolerance;Decreased balance;Decreased mobility;Pain       PT Treatment Interventions DME instruction;Gait training;Stair training;Therapeutic exercise;Therapeutic activities;Patient/family education    PT Goals (Current goals can be found in  the Care Plan section)  Acute Rehab PT Goals 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 PT Goal Formulation: With patient Time For Goal Achievement: 08/21/19 Potential to Achieve Goals: Good    Frequency 7X/week   Barriers to discharge        Co-evaluation               AM-PAC PT "6 Clicks" Mobility  Outcome Measure Help needed turning from your back to your side while in a flat bed without using bedrails?: A Little Help needed moving from lying on your back to sitting on the side of a flat bed without using bedrails?: A Little Help needed moving to and from a bed to a chair (including a wheelchair)?: A Little Help needed standing up from a chair using your arms (e.g., wheelchair or bedside chair)?: A Little Help needed to walk in hospital room?: A Little Help needed climbing 3-5 steps with a railing? : A Lot 6 Click Score: 17    End of Session Equipment Utilized During Treatment: Gait belt Activity Tolerance: Patient tolerated treatment well Patient left: in chair;with call bell/phone within reach Nurse Communication: Mobility status PT Visit Diagnosis: Difficulty in walking, not elsewhere classified (R26.2);Pain Pain - Right/Left: Right Pain - part of body: Knee    Time: [id number] PT Time Calculation (min) (ACUTE ONLY): 24 min   Charges:   PT Evaluation $PT Eval Low Complexity: 1 Low PT Treatments $Gait Training: 8-22 mins       Blondell Reveal Kistler PT 08/14/2019  Acute Rehabilitation Services Pager [phone] Office [phone]

## 2019-08-14 NOTE — Addendum Note (Signed)
Addendum  created 08/14/19 1115 by Pervis Hocking, DO   Child order released for a procedure order, Clinical Note Signed, Intraprocedure Blocks edited

## 2019-08-14 NOTE — Discharge Instructions (Signed)

## 2019-08-15 DIAGNOSIS — M1711 Unilateral primary osteoarthritis, right knee: Secondary | ICD-10-CM | POA: Diagnosis not present

## 2019-08-15 DIAGNOSIS — E663 Overweight: Secondary | ICD-10-CM | POA: Diagnosis present

## 2019-08-15 LAB — CBC
HCT: 34.4 % — ABNORMAL LOW (ref 39.0–52.0)
Hemoglobin: 11.1 g/dL — ABNORMAL LOW (ref 13.0–17.0)
MCH: 27.8 pg (ref 26.0–34.0)
MCHC: 32.3 g/dL (ref 30.0–36.0)
MCV: 86 fL (ref 80.0–100.0)
Platelets: 186 10*3/uL (ref 150–400)
RBC: 4 MIL/uL — ABNORMAL LOW (ref 4.22–5.81)
RDW: 14 % (ref 11.5–15.5)
WBC: 11.9 10*3/uL — ABNORMAL HIGH (ref 4.0–10.5)
nRBC: 0 % (ref 0.0–0.2)

## 2019-08-15 LAB — BASIC METABOLIC PANEL
Anion gap: 7 (ref 5–15)
BUN: 16 mg/dL (ref 6–20)
CO2: 26 mmol/L (ref 22–32)
Calcium: 8.7 mg/dL — ABNORMAL LOW (ref 8.9–10.3)
Chloride: 106 mmol/L (ref 98–111)
Creatinine, Ser: 0.64 mg/dL (ref 0.61–1.24)
GFR calc Af Amer: >60 mL/min (ref >60)
GFR calc non Af Amer: >60 mL/min (ref >60)
Glucose, Bld: 181 mg/dL — ABNORMAL HIGH (ref 70–99)
Potassium: 4.4 mmol/L (ref 3.5–5.1)
Sodium: 139 mmol/L (ref 135–145)

## 2019-08-15 MED ORDER — OXYCODONE HCL 5 MG PO TABS: 5.0000 mg | ORAL_TABLET | Freq: Four times a day (QID) | ORAL | 0 refills | Status: DC | PRN

## 2019-08-15 MED ORDER — METHOCARBAMOL 500 MG PO TABS: 500.0000 mg | ORAL_TABLET | Freq: Four times a day (QID) | ORAL | 0 refills | Status: DC | PRN

## 2019-08-15 MED ORDER — DOCUSATE SODIUM 100 MG PO CAPS
100.0000 mg | ORAL_CAPSULE | Freq: Two times a day (BID) | ORAL | 0 refills | Status: DC
Start: 2019-08-15 — End: 2019-11-27

## 2019-08-15 MED ORDER — ACETAMINOPHEN 500 MG PO TABS
1000.0000 mg | ORAL_TABLET | Freq: Three times a day (TID) | ORAL | 0 refills | Status: DC
Start: 2019-08-15 — End: 2019-11-27

## 2019-08-15 MED ORDER — ASPIRIN 81 MG PO CHEW
81.0000 mg | CHEWABLE_TABLET | Freq: Two times a day (BID) | ORAL | 0 refills | Status: AC
Start: 2019-08-15 — End: 2019-09-14

## 2019-08-15 MED ORDER — POLYETHYLENE GLYCOL 3350 17 G PO PACK: 17.0000 g | PACK | Freq: Two times a day (BID) | ORAL | 0 refills | Status: DC

## 2019-08-15 MED ORDER — FERROUS SULFATE 325 (65 FE) MG PO TABS: 325.0000 mg | ORAL_TABLET | Freq: Three times a day (TID) | ORAL | 0 refills | Status: DC

## 2019-08-15 MED ORDER — CELECOXIB 200 MG PO CAPS: 200.0000 mg | ORAL_CAPSULE | Freq: Two times a day (BID) | ORAL | 0 refills | Status: DC

## 2019-08-15 NOTE — TOC Transition Note (Signed)
Transition of Care [facility]) - CM/SW Discharge Note   Patient Details  Name: [name] MRN: [id number] Date of Birth: 04-19-1960  Transition of Care [facility]) CM/SW Contact:  Lia Hopping, [place] Phone Number: 08/15/2019, 10:35 AM   Clinical Narrative:    Therapy Plan: OPPT Patient has DME from previous surgery.    Final next level of care: OP Rehab Barriers to Discharge: Barriers Resolved   Patient Goals and CMS Choice     Choice offered to / list presented to : NA  Discharge Placement                       Discharge Plan and Services                DME Arranged: N/A           HH Agency: NA        Social Determinants of Health (SDOH) Interventions     Readmission Risk Interventions No flowsheet data found.

## 2019-08-15 NOTE — Progress Notes (Signed)
Physical Therapy Treatment Patient Details Name: [name] MRN: [id number] DOB: Apr 20, 1960 Today's Date: 08/15/2019    History of Present Illness R TKA; PMH L TKA 01/2019    PT Comments    Pt is progressing well with mobility and is ready to DC home from PT standpoint. He ambulated 180' with RW, completed stair training, and demonstrates good understanding of HEP.   Follow Up Recommendations  Outpatient PT;Follow surgeon's recommendation for DC plan and follow-up therapies     Equipment Recommendations  None recommended by PT    Recommendations for Other Services       Precautions / Restrictions Precautions Precautions: Knee Precaution Comments: reviewed no pillow under knee Restrictions Weight Bearing Restrictions: No    Mobility  Bed Mobility Overal bed mobility: Modified Independent Bed Mobility: Supine to Sit     Supine to sit: HOB elevated        Transfers Overall transfer level: Needs assistance Equipment used: Rolling walker (2 wheeled) Transfers: Sit to/from Stand Sit to Stand: Supervision         General transfer comment: VCs for hand placement  Ambulation/Gait Ambulation/Gait assistance: Modified independent (Device/Increase time) Gait Distance (Feet): 180 Feet Assistive device: Rolling walker (2 wheeled) Gait Pattern/deviations: Step-to pattern;Decreased step length - right;Decreased step length - left Gait velocity: decr   General Gait Details: good sequencing,  no loss of balance   Stairs Stairs: Yes Stairs assistance: Min guard Stair Management: One rail Right;Step to pattern;Forwards;With cane Number of Stairs: 5 General stair comments: VCs sequencing   Wheelchair Mobility    Modified Rankin (Stroke Patients Only)       Balance Overall balance assessment: Modified Independent                                          Cognition Arousal/Alertness: Awake/alert Behavior During Therapy: WFL for tasks  assessed/performed Overall Cognitive Status: Within Functional Limits for tasks assessed                                        Exercises Total Joint Exercises Ankle Circles/Pumps: PROM;Both;10 reps;Supine Quad Sets: AROM;Both;5 reps;Supine Short Arc Quad: AROM;Right;10 reps;Supine Heel Slides: AAROM;Right;10 reps;Supine Hip ABduction/ADduction: AROM;Right;10 reps;Supine Straight Leg Raises: AROM;Right;5 reps;Supine Long Arc Quad: AROM;Right;5 reps;Seated Knee Flexion: AAROM;Right;10 reps;Seated Goniometric ROM: 5-75* AAROM R knee    General Comments        Pertinent Vitals/Pain Pain Score: 2  Pain Location: R knee Pain Descriptors / Indicators: Sore Pain Intervention(s): Limited activity within patient's tolerance;Monitored during session;Premedicated before session;Ice applied    Home Living                      Prior Function            PT Goals (current goals can now be found in the care plan section) Acute Rehab PT Goals 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 PT Goal Formulation: With patient Time For Goal Achievement: 08/21/19 Potential to Achieve Goals: Good Progress towards PT goals: Goals met/education completed, patient discharged from PT    Frequency    7X/week      PT Plan Current plan remains appropriate    Co-evaluation              AM-PAC  PT "6 Clicks" Mobility   Outcome Measure  Help needed turning from your back to your side while in a flat bed without using bedrails?: None Help needed moving from lying on your back to sitting on the side of a flat bed without using bedrails?: None Help needed moving to and from a bed to a chair (including a wheelchair)?: None Help needed standing up from a chair using your arms (e.g., wheelchair or bedside chair)?: None Help needed to walk in hospital room?: None Help needed climbing 3-5 steps with a railing? : None 6 Click Score: 24    End of Session  Equipment Utilized During Treatment: Gait belt Activity Tolerance: Patient tolerated treatment well Patient left: in chair;with call bell/phone within reach Nurse Communication: Mobility status PT Visit Diagnosis: Difficulty in walking, not elsewhere classified (R26.2);Pain Pain - Right/Left: Right Pain - part of body: Knee     Time: 7255-0016 PT Time Calculation (min) (ACUTE ONLY): 29 min  Charges:  $Gait Training: 8-22 mins $Therapeutic Exercise: 8-22 mins                     Blondell Reveal Kistler PT 08/15/2019  Acute Rehabilitation Services Pager [phone] Office [phone]

## 2019-08-15 NOTE — Progress Notes (Signed)
     Subjective: 1 Day Post-Op Procedure(s) (LRB): TOTAL KNEE ARTHROPLASTY (Right)   Patient reports pain as mild, pain controlled. No reported events throughout the night. States that he hasn't had much pain and has worked well with PT.  States that he didn't take much pain medications after the last knee replacement a couple of months ago.   Patient's anticipated LOS is less than 2 midnights, meeting these requirements: - Younger than 98 - Lives within 1 hour of care - Has a competent adult at home to recover with post-op recover - NO history of  - Chronic pain requiring opiods  - Diabetes  - Coronary Artery Disease  - Heart failure  - Heart attack  - Stroke  - DVT/VTE  - Cardiac arrhythmia  - Respiratory Failure/COPD  - Renal failure  - Anemia  - Advanced Liver disease       Objective:   VITALS:   Vitals:   08/15/19 0144 08/15/19 0600  BP: 104/68 113/63  Pulse: 60 63  Resp: 15 14  Temp: 97.6 F (36.4 C) 98 F (36.7 C)  SpO2: 99% 97%    Dorsiflexion/Plantar flexion intact Incision: dressing C/D/I No cellulitis present Compartment soft  LABS Recent Labs    08/15/19 0249  HGB 11.1*  HCT 34.4*  WBC 11.9*  PLT 186    Recent Labs    08/15/19 0249  NA 139  K 4.4  BUN 16  CREATININE 0.64  GLUCOSE 181*     Assessment/Plan: 1 Day Post-Op Procedure(s) (LRB): TOTAL KNEE ARTHROPLASTY (Right) Foley cath d/c'ed Advance diet Up with therapy D/C IV fluids Discharge home Follow up in 2 weeks at [facility] Follow up with OLIN,Marigrace Mccole D in 2 weeks.  Contact information:  [facility] [address], [address] [id number] [id number]    Overweight (BMI 25-29.9) Estimated body mass index is 26.98 kg/m as calculated from the following:   Height as of this encounter: 5\' 10"  (1.778 m).   Weight as of this encounter: 85.3 kg. Patient also counseled that weight may inhibit the healing process Patient counseled that losing  weight will help with future health issues      Danae Orleans PA-C  [facility]  [place] [address], [address], [place], [place] [address] Phone: [phone] [email or web address] Facebook  [employer]

## 2019-08-15 NOTE — Progress Notes (Addendum)
Pt provided with d/c instructions. After discussing the pt's plan of care upon d/c home, the pt denied any further questions or concerns.  

## 2019-08-16 ENCOUNTER — Encounter: Payer: Self-pay | Admitting: *Deleted

## 2019-08-22 NOTE — Discharge Summary (Signed)
Physician Discharge Summary  Patient ID: [name] MRN: [id number] DOB/AGE: [birth year] 60 y.o.  Admit date: 08/14/2019 Discharge date: 08/15/2019   Procedures:  Procedure(s) (LRB): TOTAL KNEE ARTHROPLASTY (Right)  Attending Physician:  Dr. Paralee Cancel   Admission Diagnoses:   Right knee primary OA / pain  Discharge Diagnoses:  Principal Problem:   S/P right TKA Active Problems:   Overweight (BMI 25.0-29.9)  Past Medical History:  Diagnosis Date  . Arthritis    knees  . Hyperlipidemia   . Hypertension   . Pre-diabetes     HPI:    [name], 59 y.o. male, has a history of pain and functional disability in the right knee due to arthritis and has failed non-surgical conservative treatments for greater than 12 weeks to include NSAID's and/or analgesics and activity modification.  Onset of symptoms was gradual, starting 1.5 years ago with gradually worsening course since that time. The patient noted prior procedures on the knee to include  arthroplasty on the left knee October 2020.  Patient currently rates pain in the right knee(s) at 8 out of 10 with activity. Patient has night pain, worsening of pain with activity and weight bearing, pain that interferes with activities of daily living, pain with passive range of motion, crepitus and joint swelling.  Patient has evidence of periarticular osteophytes and joint space narrowing by imaging studies.  There is no active infection.  Risks, benefits and expectations were discussed with the patient.  Risks including but not limited to the risk of anesthesia, blood clots, nerve damage, blood vessel damage, failure of the prosthesis, infection and up to and including death.  Patient understand the risks, benefits and expectations and wishes to proceed with surgery.    PCP: Hulan Fess, MD   Discharged Condition: good  Hospital Course:  Patient underwent the above stated procedure on 08/14/2019. Patient tolerated the procedure well and  brought to the recovery room in good condition and subsequently to the floor.  POD #1 BP: 113/63 ; Pulse: 63 ; Temp: 98 F (36.7 C) ; Resp: 14 Patient reports pain as mild, pain controlled. No reported events throughout the night. States that he hasn't had much pain and has worked well with PT.  States that he didn't take much pain medications after the last knee replacement a couple of months ago. Dorsiflexion/plantar flexion intact, incision: dressing C/D/I, no cellulitis present and compartment soft.   LABS  Basename    HGB     11.1  HCT     34.4    Discharge Exam: General appearance: alert, cooperative and no distress Extremities: Homans sign is negative, no sign of DVT, no edema, redness or tenderness in the calves or thighs and no ulcers, gangrene or trophic changes  Disposition: Home with follow up in 2 weeks   Follow-up Information    Paralee Cancel, MD. Schedule an appointment as soon as possible for a visit in 2 weeks.   Specialty: Orthopedic Surgery Contact information: [address] [place] [address] [id number]           Discharge Instructions    Call MD / Call 911   Complete by: As directed    If you experience chest pain or shortness of breath, CALL 911 and be transported to the hospital emergency room.  If you develope a fever above 101 F, pus (white drainage) or increased drainage or redness at the wound, or calf pain, call your surgeon's office.   Change  dressing   Complete by: As directed    Maintain surgical dressing until follow up in the clinic. If the edges start to pull up, may reinforce with tape. If the dressing is no longer working, may remove and cover with gauze and tape, but must keep the area dry and clean.  Call with any questions or concerns.   Constipation Prevention   Complete by: As directed    Drink plenty of fluids.  Prune juice may be helpful.  You may use a stool softener, such as Colace (over the counter) 100 mg  twice a day.  Use MiraLax (over the counter) for constipation as needed.   Diet - low sodium heart healthy   Complete by: As directed    Discharge instructions   Complete by: As directed    Maintain surgical dressing until follow up in the clinic. If the edges start to pull up, may reinforce with tape. If the dressing is no longer working, may remove and cover with gauze and tape, but must keep the area dry and clean.  Follow up in 2 weeks at [facility]. Call with any questions or concerns.   Increase activity slowly as tolerated   Complete by: As directed    Weight bearing as tolerated with assist device (walker, cane, etc) as directed, use it as long as suggested by your surgeon or therapist, typically at least 4-6 weeks.   TED hose   Complete by: As directed    Use stockings (TED hose) for 2 weeks on both leg(s).  You may remove them at night for sleeping.      Allergies as of 08/15/2019   No Known Allergies     Medication List    STOP taking these medications   aspirin EC 325 MG tablet Replaced by: aspirin 81 MG chewable tablet   promethazine 25 MG tablet Commonly known as: PHENERGAN   traMADol 50 MG tablet Commonly known as: ULTRAM     TAKE these medications   acetaminophen 500 MG tablet Commonly known as: TYLENOL Take 2 tablets (1,000 mg total) by mouth every 8 (eight) hours.   aspirin 81 MG chewable tablet Commonly known as: Aspirin Childrens Chew 1 tablet (81 mg total) by mouth 2 (two) times daily. Take for 4 weeks, then resume regular dose. Replaces: aspirin EC 325 MG tablet   atorvastatin 10 MG tablet Commonly known as: LIPITOR Take 10 mg by mouth daily.   celecoxib 200 MG capsule Commonly known as: CELEBREX Take 1 capsule (200 mg total) by mouth 2 (two) times daily.   docusate sodium 100 MG capsule Commonly known as: Colace Take 1 capsule (100 mg total) by mouth 2 (two) times daily.   ferrous sulfate 325 (65 FE) MG tablet Commonly known as:  FerrouSul Take 1 tablet (325 mg total) by mouth 3 (three) times daily with meals for 14 days.   losartan 50 MG tablet Commonly known as: COZAAR Take 50 mg by mouth daily.   methocarbamol 500 MG tablet Commonly known as: Robaxin Take 1 tablet (500 mg total) by mouth every 6 (six) hours as needed for muscle spasms.   oxyCODONE 5 MG immediate release tablet Commonly known as: Oxy IR/ROXICODONE Take 1 tablet (5 mg total) by mouth every 6 (six) hours as needed for moderate pain or severe pain. What changed:   how much to take  reasons to take this   polyethylene glycol 17 g packet Commonly known as: MIRALAX / GLYCOLAX Take 17 g by mouth  2 (two) times daily.            Discharge Care Instructions  (From admission, onward)         Start     Ordered   08/15/19 0000  Change dressing    Comments: Maintain surgical dressing until follow up in the clinic. If the edges start to pull up, may reinforce with tape. If the dressing is no longer working, may remove and cover with gauze and tape, but must keep the area dry and clean.  Call with any questions or concerns.   08/15/19 [id number]           Signed: West Pugh. Mahlia Fernando   PA-C  08/22/2019, 4:49 PM

## 2019-08-26 ENCOUNTER — Encounter: Payer: Self-pay | Admitting: Anesthesiology

## 2019-08-26 MED ORDER — DEXAMETHASONE SODIUM PHOSPHATE 10 MG/ML IJ SOLN
INTRAMUSCULAR | Status: DC | PRN
  Administered 2019-08-14: 10 mg

## 2019-08-26 MED ORDER — ROPIVACAINE HCL 5 MG/ML IJ SOLN
INTRAMUSCULAR | Status: DC | PRN
  Administered 2019-08-14: 30 mL via PERINEURAL

## 2019-08-26 NOTE — Anesthesia Procedure Notes (Signed)
Anesthesia Regional Block: Adductor canal block   Pre-Anesthetic Checklist: ,, timeout performed, Correct Patient, Correct Site, Correct Laterality, Correct Procedure, Correct Position, site marked, Risks and benefits discussed,  Surgical consent,  Pre-op evaluation,  At surgeon's request and post-op pain management  Laterality: Right  Prep: Maximum Sterile Barrier Precautions used, chloraprep       Needles:  Injection technique: Single-shot  Needle Type: Echogenic Stimulator Needle     Needle Length: 9cm  Needle Gauge: 22     Additional Needles:   Procedures:,,,, ultrasound used (permanent image in chart),,,,  Narrative:  Start time: 08/14/2019 6:35 AM End time: 08/14/2019 6:40 AM Injection made incrementally with aspirations every 5 mL.  Performed by: Personally  Anesthesiologist: Pervis Hocking, DO  Additional Notes: Monitors applied. No increased pain on injection. No increased resistance to injection. Injection made in 5cc increments. Good needle visualization. Patient tolerated procedure well.

## 2019-08-26 NOTE — Addendum Note (Signed)
Addendum  created 08/26/19 2118 by Pervis Hocking, DO   Child order released for a procedure order, Clinical Note Signed, Intraprocedure Blocks edited

## 2019-09-04 ENCOUNTER — Ambulatory Visit: Payer: PRIVATE HEALTH INSURANCE

## 2019-09-04 ENCOUNTER — Ambulatory Visit: Payer: PRIVATE HEALTH INSURANCE | Attending: Internal Medicine

## 2019-09-04 DIAGNOSIS — Z23 Encounter for immunization: Secondary | ICD-10-CM

## 2019-09-04 NOTE — Progress Notes (Signed)
   Covid-19 Vaccination Clinic  Name:  [name]    MRN: [id number] DOB: 02-Mar-1961  09/04/2019  [name] was observed post Covid-19 immunization for 15 minutes without incident. He was provided with Vaccine Information Sheet and instruction to access the V-Safe system.   [name] was instructed to call 911 with any severe reactions post vaccine: Marland Kitchen Difficulty breathing  . Swelling of face and throat  . A fast heartbeat  . A bad rash all over body  . Dizziness and weakness   Immunizations Administered    Name Date Dose VIS Date Route   Pfizer COVID-19 Vaccine 09/04/2019  1:13 PM 0.3 mL 06/06/2018 Intramuscular   Manufacturer: [employer]   Lot: [id number]   [place]: [id number]

## 2019-09-25 ENCOUNTER — Ambulatory Visit: Payer: PRIVATE HEALTH INSURANCE | Attending: Internal Medicine

## 2019-09-25 DIAGNOSIS — Z23 Encounter for immunization: Secondary | ICD-10-CM

## 2019-09-25 NOTE — Progress Notes (Signed)
    Covid-19 Vaccination Clinic  Name:  [name]    MRN: [id number] DOB: [birth year]  09/25/2019  [name] was observed post Covid-19 immunization for 15 minutes without incident. He was provided with Vaccine Information Sheet and instruction to access the V-Safe system.   [name] was instructed to call 911 with any severe reactions post vaccine: Marland Kitchen Difficulty breathing  . Swelling of face and throat  . A fast heartbeat  . A bad rash all over body  . Dizziness and weakness   Immunizations Administered    Name Date Dose VIS Date Route   Pfizer COVID-19 Vaccine 09/25/2019 12:53 PM 0.3 mL 06/06/2018 Intramuscular   Manufacturer: [place]   Lot: [id number]   [place]: [id number]

## 2019-11-27 ENCOUNTER — Ambulatory Visit (INDEPENDENT_AMBULATORY_CARE_PROVIDER_SITE_OTHER): Payer: PRIVATE HEALTH INSURANCE | Admitting: Gastroenterology

## 2019-11-27 ENCOUNTER — Encounter: Payer: Self-pay | Admitting: Gastroenterology

## 2019-11-27 VITALS — BP 100/64 | HR 72 | Ht 70.0 in | Wt 171.0 lb

## 2019-11-27 DIAGNOSIS — D649 Anemia, unspecified: Secondary | ICD-10-CM | POA: Diagnosis not present

## 2019-11-27 NOTE — Patient Instructions (Signed)
If you are age 60 or older, your body mass index should be between 23-30. Your Body mass index is 24.54 kg/m. If this is out of the aforementioned range listed, please consider follow up with your Primary Care Provider.  If you are age 50 or younger, your body mass index should be between 19-25. Your Body mass index is 24.54 kg/m. If this is out of the aformentioned range listed, please consider follow up with your Primary Care Provider.   We will request records from Dr Rex Kras at [facility] for review.  We will contact you after lab work has been reviewed.  Thank you for entrusting me with your care and choosing [facility].  Dr Ardis Hughs

## 2019-11-27 NOTE — Progress Notes (Signed)
Review of pertinent gastrointestinal problems: 1.  History of precancerous colon polyps: Colonoscopy 2012 single subcentimeter sessile serrated adenoma was removed.  Colonoscopy August 2019 a single 2 mm polyp was removed, it was not neoplastic on pathology.  He was recommended to have repeat colon cancer screening at 10-year interval.   HPI: This is a very pleasant 59 year old man who was referred to me by Victor Fess, MD  to evaluate anemia.     I last saw him about 2 years ago the time of a colonoscopy.  See that results summarized above.  He is here today to discuss a new problem  Old Data Reviewed: Blood work October 2020 showed hemoglobin 10.7, MCV 87. Blood work April 2021 showed hemoglobin 13.3, MCV 85. Blood work May 5, the day after orthopedic surgery, hemoglobin 11.1, MCV 86   He tells me that he has seen no overt GI bleeding.  No melena, no bright red blood per rectum, no hematemesis.  He has had no significant abdominal pain.  He tells me that his primary care physician checked his stool for occult blood and it was negative on 3 samples.  He has lost 20 pounds intentionally in the past 3 months after cutting out carbohydrates.  He really does not take many NSAIDs at all however he did shortly after his knee surgery 2 or 3 months ago.   Review of systems: Pertinent positive and negative review of systems were noted in the above HPI section. All other review negative.   Past Medical History:  Diagnosis Date  . Arthritis    knees  . Hyperlipidemia   . Hypertension   . Pre-diabetes     Past Surgical History:  Procedure Laterality Date  . COLONOSCOPY    . HEMORROIDECTOMY    . INGUINAL HERNIA REPAIR    . MENISCUS REPAIR Left   . POLYPECTOMY    . SIGMOIDOSCOPY  14yrs ago   Dr.Edwards  . TOTAL KNEE ARTHROPLASTY Left 01/17/2019   Procedure: TOTAL KNEE ARTHROPLASTY;  Surgeon: Latanya Maudlin, MD;  Location: [facility];  Service: Orthopedics;  Laterality: Left;  142min  .  TOTAL KNEE ARTHROPLASTY Right 08/14/2019   Procedure: TOTAL KNEE ARTHROPLASTY;  Surgeon: Paralee Cancel, MD;  Location: [facility];  Service: Orthopedics;  Laterality: Right;  70 mins    Current Outpatient Medications  Medication Sig Dispense Refill  . atorvastatin (LIPITOR) 10 MG tablet Take 10 mg by mouth daily.     Marland Kitchen losartan (COZAAR) 50 MG tablet Take 50 mg by mouth daily.     No current facility-administered medications for this visit.    Allergies as of 11/27/2019  . (No Known Allergies)    Family History  Problem Relation Age of Onset  . Colon cancer Neg Hx   . Colon polyps Neg Hx   . Esophageal cancer Neg Hx   . Rectal cancer Neg Hx   . Stomach cancer Neg Hx     Social History   Socioeconomic History  . Marital status: Married    Spouse name: Not on file  . Number of children: Not on file  . Years of education: Not on file  . Highest education level: Not on file  Occupational History  . Not on file  Tobacco Use  . Smoking status: Former Research scientist (life sciences)  . Smokeless tobacco: Former Systems developer  . Tobacco comment: unknown quit date   Vaping Use  . Vaping Use: Never used  Substance and Sexual Activity  . Alcohol use: Not Currently  Alcohol/week: 18.0 standard drinks    Types: 18 Cans of beer per week    Comment: no etoh now x 5 yrs   . Drug use: No  . Sexual activity: Not on file  Other Topics Concern  . Not on file  Social History Narrative  . Not on file   Social Determinants of Health   Financial Resource Strain:   . Difficulty of Paying Living Expenses:   Food Insecurity:   . Worried About Charity fundraiser in the Last Year:   . Arboriculturist in the Last Year:   Transportation Needs:   . Film/video editor (Medical):   Marland Kitchen Lack of Transportation (Non-Medical):   Physical Activity:   . Days of Exercise per Week:   . Minutes of Exercise per Session:   Stress:   . Feeling of Stress :   Social Connections:   . Frequency of Communication with Friends and  Family:   . Frequency of Social Gatherings with Friends and Family:   . Attends Religious Services:   . Active Member of Clubs or Organizations:   . Attends Archivist Meetings:   Marland Kitchen Marital Status:   Intimate Partner Violence:   . Fear of Current or Ex-Partner:   . Emotionally Abused:   Marland Kitchen Physically Abused:   . Sexually Abused:      Physical Exam: BP 100/64   Pulse 72   Ht 5\' 10"  (1.778 m)   Wt 171 lb (77.6 kg)   BMI 24.54 kg/m  Constitutional: generally well-appearing Psychiatric: alert and oriented x3 Eyes: extraocular movements intact Mouth: oral pharynx moist, no lesions Neck: supple no lymphadenopathy Cardiovascular: heart regular rate and rhythm Lungs: clear to auscultation bilaterally Abdomen: soft, nontender, nondistended, no obvious ascites, no peritoneal signs, normal bowel sounds Extremities: no lower extremity edema bilaterally Skin: no lesions on visible extremities   Assessment and plan: 59 y.o. male with normocytic anemia, Hemoccult negative  I am not sure if his anemia has anything to do with his GI tract.  I am going to reach out to his primary care office to see if he has had any more anemia work-up, such as ferritin, folate, B12.  We will get all those results sent here if he has not had that testing I would do that for him.  He does understand that since he had a colonoscopy 2 years ago that would probably not need to be repeated however upper endoscopy may be reasonable if we can find no other source to his anemia and if there is good evidence that it may be related to his GI tract.  Please see the "Patient Instructions" section for addition details about the plan.   Victor Loffler, MD [facility] 11/27/2019, 2:04 PM  Cc: Victor Fess, MD  Total time on date of encounter was 45 minutes (this included time spent preparing to see the patient reviewing records; obtaining and/or reviewing separately obtained history; performing a  medically appropriate exam and/or evaluation; counseling and educating the patient and family if present; ordering medications, tests or procedures if applicable; and documenting clinical information in the health record).

## 2019-12-06 ENCOUNTER — Telehealth: Payer: Self-pay | Admitting: Gastroenterology

## 2019-12-06 NOTE — Telephone Encounter (Signed)
Pt is requesting a call back from a nurse regarding his lab results. 

## 2019-12-06 NOTE — Telephone Encounter (Signed)
Spoke with patient, he is wanting to know if Dr. Ardis Hughs has received his labs from his PCP and reviewed them. Advised that I will reach out to his CMA to see if they have seen anything, if not we will request receords again and once Dr. Ardis Hughs reviews them we will call with next steps and recommendations.   Elmyra Ricks, could you look into this for me this afternoon? Thank you

## 2019-12-06 NOTE — Telephone Encounter (Signed)
Left message for records to be sent to our office with [place] [facility].

## 2019-12-11 NOTE — Telephone Encounter (Signed)
Left message for patient to return call to discuss results.  Will continue efforts.

## 2019-12-11 NOTE — Telephone Encounter (Signed)
Reviewed labs dated July and April 2021.  July 2021 ferritin 106, hemoglobin 12.8, MCV 80  April 2021 ferritin 65  Creatinine 0.9 LFTs all completely normal hemoglobin 12.9 MCV 80  Hemoccult testing May 2021 3 different cards all were negative  Please let him know that I reviewed all of his labs from his primary care physician he has very very mild anemia, he is not iron deficient.  He is not Hemoccult positive on 3 cards.  He is not having any overt GI bleeding and no GI symptoms.  Let him know that I do not think he needs any invasive or noninvasive GI testing.  I think he should discuss further anemia work-up with his primary care physician.

## 2019-12-11 NOTE — Telephone Encounter (Signed)
Patient returned call and recommendations made by Dr Ardis Hughs were discussed with patient.  Patient advised to follow up with his primary care physician to further discuss anemia work up.  Patient agreed to plan and verbalized understanding.  No further questions.

## 2019-12-18 ENCOUNTER — Telehealth: Payer: Self-pay | Admitting: Gastroenterology

## 2019-12-18 NOTE — Telephone Encounter (Signed)
I spoke with Sharee Pimple at [facility] and made her aware of the recommendations by Dr Ardis Hughs;   "Let him know that I do not think he needs any invasive or noninvasive GI testing.  I think he should discuss further anemia work-up with his primary care physician."  She thanked me for the call.
